# Patient Record
Sex: Female | Born: 1985 | Race: White | Hispanic: No | Marital: Married | Smoking: Never smoker
Health system: Southern US, Community
[De-identification: ages and names within clinical notes are randomized; demographics above are authoritative.]

## PROBLEM LIST (undated history)

## (undated) ENCOUNTER — Inpatient Hospital Stay (HOSPITAL_COMMUNITY): Payer: Self-pay

## (undated) DIAGNOSIS — F32A Depression, unspecified: Secondary | ICD-10-CM

## (undated) DIAGNOSIS — O1490 Unspecified pre-eclampsia, unspecified trimester: Secondary | ICD-10-CM

## (undated) DIAGNOSIS — R3129 Other microscopic hematuria: Secondary | ICD-10-CM

## (undated) DIAGNOSIS — F329 Major depressive disorder, single episode, unspecified: Secondary | ICD-10-CM

## (undated) DIAGNOSIS — F419 Anxiety disorder, unspecified: Secondary | ICD-10-CM

## (undated) DIAGNOSIS — R339 Retention of urine, unspecified: Secondary | ICD-10-CM

## (undated) DIAGNOSIS — K219 Gastro-esophageal reflux disease without esophagitis: Secondary | ICD-10-CM

## (undated) HISTORY — DX: Other microscopic hematuria: R31.29

## (undated) HISTORY — DX: Retention of urine, unspecified: R33.9

---

## 2013-08-21 DIAGNOSIS — O1490 Unspecified pre-eclampsia, unspecified trimester: Secondary | ICD-10-CM

## 2013-08-21 HISTORY — DX: Unspecified pre-eclampsia, unspecified trimester: O14.90

## 2014-04-08 ENCOUNTER — Encounter (HOSPITAL_COMMUNITY): Payer: Self-pay | Admitting: *Deleted

## 2014-04-08 ENCOUNTER — Inpatient Hospital Stay (HOSPITAL_COMMUNITY)
Admission: AD | Admit: 2014-04-08 | Discharge: 2014-04-08 | Disposition: A | Discharge status: Home / Self Care | Payer: Managed Care, Other (non HMO) | Source: Ambulatory Visit | Attending: Obstetrics and Gynecology | Admitting: Obstetrics and Gynecology

## 2014-04-08 DIAGNOSIS — O1492 Unspecified pre-eclampsia, second trimester: Secondary | ICD-10-CM

## 2014-04-08 DIAGNOSIS — IMO0002 Reserved for concepts with insufficient information to code with codable children: Secondary | ICD-10-CM | POA: Diagnosis not present

## 2014-04-08 DIAGNOSIS — R030 Elevated blood-pressure reading, without diagnosis of hypertension: Secondary | ICD-10-CM | POA: Diagnosis present

## 2014-04-08 LAB — COMPREHENSIVE METABOLIC PANEL
ALT: 9 U/L (ref 0–35)
ANION GAP: 13 (ref 5–15)
AST: 17 U/L (ref 0–37)
Albumin: 2.9 g/dL — ABNORMAL LOW (ref 3.5–5.2)
Alkaline Phosphatase: 89 U/L (ref 39–117)
BUN: 9 mg/dL (ref 6–23)
CO2: 23 meq/L (ref 19–32)
Calcium: 8.7 mg/dL (ref 8.4–10.5)
Chloride: 101 mEq/L (ref 96–112)
Creatinine, Ser: 0.67 mg/dL (ref 0.50–1.10)
GFR calc non Af Amer: >90 mL/min (ref >90)
GLUCOSE: 72 mg/dL (ref 70–99)
POTASSIUM: 4.9 meq/L (ref 3.7–5.3)
Sodium: 137 mEq/L (ref 137–147)
TOTAL PROTEIN: 6 g/dL (ref 6.0–8.3)
Total Bilirubin: 0.3 mg/dL (ref 0.3–1.2)

## 2014-04-08 LAB — URINALYSIS, ROUTINE W REFLEX MICROSCOPIC
Bilirubin Urine: NEGATIVE
Glucose, UA: NEGATIVE mg/dL
Hgb urine dipstick: NEGATIVE
KETONES UR: NEGATIVE mg/dL
LEUKOCYTES UA: NEGATIVE
Nitrite: NEGATIVE
PROTEIN: 100 mg/dL — AB
Specific Gravity, Urine: 1.025 (ref 1.005–1.030)
UROBILINOGEN UA: 0.2 mg/dL (ref 0.0–1.0)
pH: 6.5 (ref 5.0–8.0)

## 2014-04-08 LAB — URINE MICROSCOPIC-ADD ON

## 2014-04-08 LAB — CBC
HEMATOCRIT: 35.4 % — AB (ref 36.0–46.0)
HEMOGLOBIN: 12.2 g/dL (ref 12.0–15.0)
MCH: 29.8 pg (ref 26.0–34.0)
MCHC: 34.5 g/dL (ref 30.0–36.0)
MCV: 86.3 fL (ref 78.0–100.0)
Platelets: 162 10*3/uL (ref 150–400)
RBC: 4.1 MIL/uL (ref 3.87–5.11)
RDW: 14.4 % (ref 11.5–15.5)
WBC: 9.5 10*3/uL (ref 4.0–10.5)

## 2014-04-08 LAB — PROTEIN / CREATININE RATIO, URINE
CREATININE, URINE: 154.36 mg/dL
Protein Creatinine Ratio: 0.64 — ABNORMAL HIGH (ref 0.00–0.15)
Total Protein, Urine: 98.5 mg/dL

## 2014-04-08 LAB — URIC ACID: Uric Acid, Serum: 5.6 mg/dL (ref 2.4–7.0)

## 2014-04-08 LAB — LACTATE DEHYDROGENASE: LDH: 202 U/L (ref 94–250)

## 2014-04-08 NOTE — MAU Note (Signed)
Pt presents to MAU from physicians office for [facility] evaluation.

## 2014-04-08 NOTE — MAU Provider Note (Signed)
History     CSN: [id number]  Arrival date and time: 04/08/14 1237   None     Chief Complaint  Patient presents with  . PIH eval    HPI  [name] is a 28 y.o. female G1P0 at 49w1dwho presents to MAU for a PIH evaluation. The patient was sent over from Dr. HDelanna Ahmadioffice following elevated BP readings in the office. The patient denies HA, or blurred vision.   OB History   Grav Para Term Preterm Abortions TAB SAB Ect Mult Living   1         0      Past Medical History  Diagnosis Date  . Medical history non-contributory     Past Surgical History  Procedure Laterality Date  . No past surgeries      History reviewed. No pertinent family history.  History  Substance Use Topics  . Smoking status: Never Smoker   . Smokeless tobacco: Not on file  . Alcohol Use: No    Allergies:  Allergies  Allergen Reactions  . Sulfa Antibiotics Other (See Comments)    Childhood reaction    Prescriptions prior to admission  Medication Sig Dispense Refill  . Doxylamine-Pyridoxine (DICLEGIS PO) Take 1 tablet by mouth daily.      .Marland KitchenOVER THE COUNTER MEDICATION Take 1 tablet by mouth daily. Patient takes Nexium 24 over the counter      . Prenatal Vit-Fe Fumarate-FA (PRENATAL MULTIVITAMIN) TABS tablet Take 1 tablet by mouth daily at 12 noon.      . ranitidine (ZANTAC) 150 MG tablet Take 150 mg by mouth 2 (two) times daily.      . sertraline (ZOLOFT) 25 MG tablet Take 25 mg by mouth daily.       Results for orders placed during the hospital encounter of 04/08/14 (from the past 48 hour(s))  URINALYSIS, ROUTINE W REFLEX MICROSCOPIC     Status: Abnormal   Collection Time    04/08/14  1:35 PM      Result Value Ref Range   Color, Urine YELLOW  YELLOW   APPearance CLEAR  CLEAR   Specific Gravity, Urine 1.025  1.005 - 1.030   pH 6.5  5.0 - 8.0   Glucose, UA NEGATIVE  NEGATIVE mg/dL   Hgb urine dipstick NEGATIVE  NEGATIVE   Bilirubin Urine NEGATIVE  NEGATIVE   Ketones, ur  NEGATIVE  NEGATIVE mg/dL   Protein, ur 100 (*) NEGATIVE mg/dL   Urobilinogen, UA 0.2  0.0 - 1.0 mg/dL   Nitrite NEGATIVE  NEGATIVE   Leukocytes, UA NEGATIVE  NEGATIVE  URINE MICROSCOPIC-ADD ON     Status: Abnormal   Collection Time    04/08/14  1:35 PM      Result Value Ref Range   Squamous Epithelial / LPF FEW (*) RARE   WBC, UA 7-10  <3 WBC/hpf   RBC / HPF 3-6  <3 RBC/hpf   Bacteria, UA MANY (*) RARE   Urine-Other MUCOUS PRESENT    PROTEIN / CREATININE RATIO, URINE     Status: Abnormal   Collection Time    04/08/14  1:35 PM      Result Value Ref Range   Creatinine, Urine 154.36     Total Protein, Urine 98.5     Comment: NO NORMAL RANGE ESTABLISHED FOR THIS TEST   PROTEIN CREATININE RATIO 0.64 (*) 0.00 - 0.15  CBC     Status: Abnormal   Collection Time    04/08/14  2:09 PM      Result Value Ref Range   WBC 9.5  4.0 - 10.5 K/uL   RBC 4.10  3.87 - 5.11 MIL/uL   Hemoglobin 12.2  12.0 - 15.0 g/dL   HCT 35.4 (*) 36.0 - 46.0 %   MCV 86.3  78.0 - 100.0 fL   MCH 29.8  26.0 - 34.0 pg   MCHC 34.5  30.0 - 36.0 g/dL   RDW 14.4  11.5 - 15.5 %   Platelets 162  150 - 400 K/uL  COMPREHENSIVE METABOLIC PANEL     Status: Abnormal   Collection Time    04/08/14  2:09 PM      Result Value Ref Range   Sodium 137  137 - 147 mEq/L   Potassium 4.9  3.7 - 5.3 mEq/L   Chloride 101  96 - 112 mEq/L   CO2 23  19 - 32 mEq/L   Glucose, Bld 72  70 - 99 mg/dL   BUN 9  6 - 23 mg/dL   Creatinine, Ser 0.67  0.50 - 1.10 mg/dL   Calcium 8.7  8.4 - 10.5 mg/dL   Total Protein 6.0  6.0 - 8.3 g/dL   Albumin 2.9 (*) 3.5 - 5.2 g/dL   AST 17  0 - 37 U/L   ALT 9  0 - 35 U/L   Alkaline Phosphatase 89  39 - 117 U/L   Total Bilirubin 0.3  0.3 - 1.2 mg/dL   GFR calc non Af Amer >90  >90 mL/min   GFR calc Af Amer >90  >90 mL/min   Comment: (NOTE)     The eGFR has been calculated using the CKD EPI equation.     This calculation has not been validated in all clinical situations.     eGFR's persistently <90  mL/min signify possible Chronic Kidney     Disease.   Anion gap 13  5 - 15  URIC ACID     Status: None   Collection Time    04/08/14  2:09 PM      Result Value Ref Range   Uric Acid, Serum 5.6  2.4 - 7.0 mg/dL  LACTATE DEHYDROGENASE     Status: None   Collection Time    04/08/14  2:09 PM      Result Value Ref Range   LDH 202  94 - 250 U/L    Review of Systems  Constitutional: Negative for fever and chills.  Eyes: Negative for blurred vision.  Respiratory: Negative for shortness of breath.   Cardiovascular: Negative for leg swelling.  Gastrointestinal: Positive for nausea and abdominal pain (+ Upper abdominal discomfort ).  Neurological: Negative for headaches.   Physical Exam   Blood pressure 147/91, pulse 65, resp. rate 18, last menstrual period 09/23/2013.  Physical Exam  Constitutional: She is oriented to person, place, and time. She appears well-developed and well-nourished. No distress.  HENT:  Head: Normocephalic.  Eyes: Pupils are equal, round, and reactive to light.  Neck: Neck supple.  Cardiovascular: Normal rate.   Respiratory: Effort normal and breath sounds normal. No respiratory distress.  GI: Soft. She exhibits no distension. There is no tenderness. There is no rebound and no guarding.  Musculoskeletal:       Right ankle: She exhibits no swelling.       Left ankle: She exhibits no swelling.  Neurological: She is alert and oriented to person, place, and time. She has normal reflexes.  Skin: Skin is warm.  She is not diaphoretic.  Psychiatric: Her behavior is normal. Her mood appears anxious.    Fetal Tracing: Baseline: 130 bpm  Variability: Moderate  Accelerations: 15x15 Decelerations: none Toco: None  MAU Course  Procedures None  MDM CBC CMET Uric acid LDH Protein/Creatine urine  Urine culture  Discussed patient and results with Dr. Matthew Saras.   Assessment and Plan   A:  1. Preeclampsia, second trimester    P:  Discharge home in  stable condition Follow up with Dr. Matthew Saras on Friday; the office will call you Preeclampsia discussed with patient; warning signs discussed    Darrelyn Hillock Jadarius Commons, NP  04/08/2014, 7:33 PM

## 2014-04-08 NOTE — Discharge Instructions (Signed)
Hypertension During Pregnancy Hypertension, or high blood pressure, is when there is extra pressure inside your blood vessels that carry blood from the heart to the rest of your body (arteries). It can happen at any time in life, including pregnancy. Hypertension during pregnancy can cause problems for you and your baby. Your baby might not weigh as much as he or she should at birth or might be born early (premature). Very bad cases of hypertension during pregnancy can be life-threatening.  Different types of hypertension can occur during pregnancy. These include:  Chronic hypertension. This happens when a woman has hypertension before pregnancy and it continues during pregnancy.  Gestational hypertension. This is when hypertension develops during pregnancy.  Preeclampsia or toxemia of pregnancy. This is a very serious type of hypertension that develops only during pregnancy. It affects the whole body and can be very dangerous for both mother and baby.  Gestational hypertension and preeclampsia usually go away after your baby is born. Your blood pressure will likely stabilize within 6 weeks. Women who have hypertension during pregnancy have a greater chance of developing hypertension later in life or with future pregnancies. RISK FACTORS There are certain factors that make it more likely for you to develop hypertension during pregnancy. These include:  Having hypertension before pregnancy.  Having hypertension during a previous pregnancy.  Being overweight.  Being older than 40 years.  Being pregnant with more than one baby.  Having diabetes or kidney problems. SIGNS AND SYMPTOMS Chronic and gestational hypertension rarely cause symptoms. Preeclampsia has symptoms, which may include:  Increased protein in your urine. Your health care provider will check for this at every prenatal visit.  Swelling of your hands and face.  Rapid weight gain.  Headaches.  Visual changes.  Being  bothered by light.  Abdominal pain, especially in the upper right area.  Chest pain.  Shortness of breath.  Increased reflexes.  Seizures. These occur with a more severe form of preeclampsia, called eclampsia. DIAGNOSIS  You may be diagnosed with hypertension during a regular prenatal exam. At each prenatal visit, you may have:  Your blood pressure checked.  A urine test to check for protein in your urine. The type of hypertension you are diagnosed with depends on when you developed it. It also depends on your specific blood pressure reading.  Developing hypertension before 20 weeks of pregnancy is consistent with chronic hypertension.  Developing hypertension after 20 weeks of pregnancy is consistent with gestational hypertension.  Hypertension with increased urinary protein is diagnosed as preeclampsia.  Blood pressure measurements that stay above 834 systolic or 196 diastolic are a sign of severe preeclampsia. TREATMENT Treatment for hypertension during pregnancy varies. Treatment depends on the type of hypertension and how serious it is.  If you take medicine for chronic hypertension, you may need to switch medicines.  Medicines called ACE inhibitors should not be taken during pregnancy.  Low-dose aspirin may be suggested for women who have risk factors for preeclampsia.  If you have gestational hypertension, you may need to take a blood pressure medicine that is safe during pregnancy. Your health care provider will recommend the correct medicine.  If you have severe preeclampsia, you may need to be in the hospital. Health care providers will watch you and your baby very closely. You also may need to take medicine called magnesium sulfate to prevent seizures and lower blood pressure.  Sometimes, an early delivery is needed. This may be the case if the condition worsens. It would be  done to protect you and your baby. The only cure for preeclampsia is delivery.  Your health  care provider may recommend that you take one low-dose aspirin (81 mg) each day to help prevent high blood pressure during your pregnancy if you are at risk for preeclampsia. You may be at risk for preeclampsia if:  You had preeclampsia or eclampsia during a previous pregnancy.  Your baby did not grow as expected during a previous pregnancy.  You experienced preterm birth with a previous pregnancy.  You experienced a separation of the placenta from the uterus (placental abruption) during a previous pregnancy.  You experienced the loss of your baby during a previous pregnancy.  You are pregnant with more than one baby.  You have other medical conditions, such as diabetes or an autoimmune disease. HOME CARE INSTRUCTIONS  Schedule and keep all of your regular prenatal care appointments. This is important.  Take medicines only as directed by your health care provider. Tell your health care provider about all medicines you take.  Eat as little salt as possible.  Get regular exercise.  Do not drink alcohol.  Do not use tobacco products.  Do not drink products with caffeine.  Lie on your left side when resting. SEEK IMMEDIATE MEDICAL CARE IF:  You have severe abdominal pain.  You have sudden swelling in your hands, ankles, or face.  You gain 4 pounds (1.8 kg) or more in 1 week.  You vomit repeatedly.  You have vaginal bleeding.  You do not feel your baby moving as much.  You have a headache.  You have blurred or double vision.  You have muscle twitching or spasms.  You have shortness of breath.  You have blue fingernails or lips.  You have blood in your urine. MAKE SURE YOU:  Understand these instructions.  Will watch your condition.  Will get help right away if you are not doing well or get worse. Document Released: 04/25/2011 Document Revised: 12/22/2013 Document Reviewed: 03/06/2013 [facility] Patient Information 2015 [place], [place]. This information is not  intended to replace advice given to you by your health care provider. Make sure you discuss any questions you have with your health care provider.  Preeclampsia and Eclampsia Preeclampsia is a serious condition that develops only during pregnancy. It is also called toxemia of pregnancy. This condition causes high blood pressure along with other symptoms, such as swelling and headaches. These may develop as the condition gets worse. Preeclampsia may occur 20 weeks or later into your pregnancy.  Diagnosing and treating preeclampsia early is very important. If not treated early, it can cause serious problems for you and your baby. One problem it can lead to is eclampsia, which is a condition that causes muscle jerking or shaking (convulsions) in the mother. Delivering your baby is the best treatment for preeclampsia or eclampsia.  RISK FACTORS The cause of preeclampsia is not known. You may be more likely to develop preeclampsia if you have certain risk factors. These include:   Being pregnant for the first time.  Having preeclampsia in a past pregnancy.  Having a family history of preeclampsia.  Having high blood pressure.  Being pregnant with twins or triplets.  Being 40 or older.  Being African American.  Having kidney disease or diabetes.  Having medical conditions such as lupus or blood diseases.  Being very overweight (obese). SIGNS AND SYMPTOMS  The earliest signs of preeclampsia are:  High blood pressure.  Increased protein in your urine. Your health care  provider will check for this at every prenatal visit. Other symptoms that can develop include:   Severe headaches.  Sudden weight gain.  Swelling of your hands, face, legs, and feet.  Feeling sick to your stomach (nauseous) and throwing up (vomiting).  Vision problems (blurred or double vision).  Numbness in your face, arms, legs, and feet.  Dizziness.  Slurred speech.  Sensitivity to bright  lights.  Abdominal pain. DIAGNOSIS  There are no screening tests for preeclampsia. Your health care provider will ask you about symptoms and check for signs of preeclampsia during your prenatal visits. You may also have tests, including:  Urine testing.  Blood testing.  Checking your baby's heart rate.  Checking the health of your baby and your placenta using images created with sound waves (ultrasound). TREATMENT  You can work out the best treatment approach together with your health care provider. It is very important to keep all prenatal appointments. If you have an increased risk of preeclampsia, you may need more frequent prenatal exams.  Your health care provider may prescribe bed rest.  You may have to eat as little salt as possible.  You may need to take medicine to lower your blood pressure if the condition does not respond to more conservative measures.  You may need to stay in the hospital if your condition is severe. There, treatment will focus on controlling your blood pressure and fluid retention. You may also need to take medicine to prevent seizures.  If the condition gets worse, your baby may need to be delivered early to protect you and the baby. You may have your labor started with medicine (be induced), or you may have a cesarean delivery.  Preeclampsia usually goes away after the baby is born. HOME CARE INSTRUCTIONS   Only take over-the-counter or prescription medicines as directed by your health care provider.  Lie on your left side while resting. This keeps pressure off your baby.  Elevate your feet while resting.  Get regular exercise. Ask your health care provider what type of exercise is safe for you.  Avoid caffeine and alcohol.  Do not smoke.  Drink 6-8 glasses of water every day.  Eat a balanced diet that is low in salt. Do not add salt to your food.  Avoid stressful situations as much as possible.  Get plenty of rest and sleep.  Keep all  prenatal appointments and tests as scheduled. SEEK MEDICAL CARE IF:  You are gaining more weight than expected.  You have any headaches, abdominal pain, or nausea.  You are bruising more than usual.  You feel dizzy or light-headed. SEEK IMMEDIATE MEDICAL CARE IF:   You develop sudden or severe swelling anywhere in your body. This usually happens in the legs.  You gain 5 lb (2.3 kg) or more in a week.  You have a severe headache, dizziness, problems with your vision, or confusion.  You have severe abdominal pain.  You have lasting nausea or vomiting.  You have a seizure.  You have trouble moving any part of your body.  You develop numbness in your body.  You have trouble speaking.  You have any abnormal bleeding.  You develop a stiff neck.  You pass out. MAKE SURE YOU:   Understand these instructions.  Will watch your condition.  Will get help right away if you are not doing well or get worse. Document Released: 08/04/2000 Document Revised: 08/12/2013 Document Reviewed: 05/30/2013 [facility] Patient Information 2015 [place], [place]. This information is not  intended to replace advice given to you by your health care provider. Make sure you discuss any questions you have with your health care provider. ° °

## 2014-04-09 ENCOUNTER — Encounter (HOSPITAL_COMMUNITY): Payer: Self-pay | Admitting: Pediatrics

## 2014-04-09 ENCOUNTER — Inpatient Hospital Stay (HOSPITAL_COMMUNITY): Payer: Managed Care, Other (non HMO)

## 2014-04-09 ENCOUNTER — Inpatient Hospital Stay (HOSPITAL_COMMUNITY)
Admission: AD | Admit: 2014-04-09 | Discharge: 2014-04-09 | Disposition: A | Discharge status: Home / Self Care | Payer: Managed Care, Other (non HMO) | Source: Ambulatory Visit | Attending: Obstetrics & Gynecology | Admitting: Obstetrics & Gynecology

## 2014-04-09 DIAGNOSIS — O133 Gestational [pregnancy-induced] hypertension without significant proteinuria, third trimester: Secondary | ICD-10-CM

## 2014-04-09 DIAGNOSIS — O139 Gestational [pregnancy-induced] hypertension without significant proteinuria, unspecified trimester: Secondary | ICD-10-CM | POA: Insufficient documentation

## 2014-04-09 DIAGNOSIS — R109 Unspecified abdominal pain: Secondary | ICD-10-CM | POA: Diagnosis not present

## 2014-04-09 DIAGNOSIS — R030 Elevated blood-pressure reading, without diagnosis of hypertension: Secondary | ICD-10-CM | POA: Diagnosis present

## 2014-04-09 LAB — COMPREHENSIVE METABOLIC PANEL
ALBUMIN: 2.7 g/dL — AB (ref 3.5–5.2)
ALK PHOS: 84 U/L (ref 39–117)
ALT: 9 U/L (ref 0–35)
ANION GAP: 9 (ref 5–15)
AST: 19 U/L (ref 0–37)
BUN: 12 mg/dL (ref 6–23)
CO2: 23 mEq/L (ref 19–32)
CREATININE: 0.69 mg/dL (ref 0.50–1.10)
Calcium: 8.4 mg/dL (ref 8.4–10.5)
Chloride: 102 mEq/L (ref 96–112)
GFR calc non Af Amer: >90 mL/min (ref >90)
GLUCOSE: 95 mg/dL (ref 70–99)
Potassium: 4.6 mEq/L (ref 3.7–5.3)
Sodium: 134 mEq/L — ABNORMAL LOW (ref 137–147)
TOTAL PROTEIN: 6.3 g/dL (ref 6.0–8.3)
Total Bilirubin: 0.3 mg/dL (ref 0.3–1.2)

## 2014-04-09 LAB — CBC
HEMATOCRIT: 34.4 % — AB (ref 36.0–46.0)
Hemoglobin: 11.9 g/dL — ABNORMAL LOW (ref 12.0–15.0)
MCH: 30 pg (ref 26.0–34.0)
MCHC: 34.6 g/dL (ref 30.0–36.0)
MCV: 86.6 fL (ref 78.0–100.0)
Platelets: 166 10*3/uL (ref 150–400)
RBC: 3.97 MIL/uL (ref 3.87–5.11)
RDW: 14.5 % (ref 11.5–15.5)
WBC: 10.5 10*3/uL (ref 4.0–10.5)

## 2014-04-09 LAB — URINALYSIS, ROUTINE W REFLEX MICROSCOPIC
Bilirubin Urine: NEGATIVE
GLUCOSE, UA: NEGATIVE mg/dL
Hgb urine dipstick: NEGATIVE
KETONES UR: 15 mg/dL — AB
Leukocytes, UA: NEGATIVE
Nitrite: NEGATIVE
PROTEIN: 100 mg/dL — AB
Specific Gravity, Urine: >1.03 — ABNORMAL HIGH (ref 1.005–1.030)
UROBILINOGEN UA: 0.2 mg/dL (ref 0.0–1.0)
pH: 6 (ref 5.0–8.0)

## 2014-04-09 LAB — URINE MICROSCOPIC-ADD ON

## 2014-04-09 LAB — URINE CULTURE

## 2014-04-09 LAB — URIC ACID: URIC ACID, SERUM: 6.4 mg/dL (ref 2.4–7.0)

## 2014-04-09 LAB — PROTEIN / CREATININE RATIO, URINE
Creatinine, Urine: 295.18 mg/dL
PROTEIN CREATININE RATIO: 0.59 — AB (ref 0.00–0.15)
Total Protein, Urine: 174.9 mg/dL

## 2014-04-09 LAB — LACTATE DEHYDROGENASE: LDH: 200 U/L (ref 94–250)

## 2014-04-09 MED ORDER — BETAMETHASONE SOD PHOS & ACET 6 (3-3) MG/ML IJ SUSP
12.0000 mg | Freq: Once | INTRAMUSCULAR | Status: AC
  Administered 2014-04-09: 12 mg via INTRAMUSCULAR
  Filled 2014-04-09: qty 2

## 2014-04-09 NOTE — MAU Provider Note (Signed)
History     CSN: [id number]  Arrival date and time: 04/09/14 1534   First Provider Initiated Contact with Patient 04/09/14 1621      Chief Complaint  Patient presents with  . Hypertension   HPI Comments: Diana Simpson 28 y.o. G1P0 [redacted]w[redacted]d presents to MAU with elevated BP and abdominal pains earlier today. She was seen yesterday here and diagnosed with early preeclampsia and sent home. She went home and was doing well today until she checked her BP at a pharmacy and it was elevated. She states her abdomen was bothering her earlier across her abdomen but is now fine. She denies any headache, blurred vision, edema.       Past Medical History  Diagnosis Date  . Medical history non-contributory     Past Surgical History  Procedure Laterality Date  . No past surgeries      History reviewed. No pertinent family history.  History  Substance Use Topics  . Smoking status: Never Smoker   . Smokeless tobacco: Not on file  . Alcohol Use: No    Allergies:  Allergies  Allergen Reactions  . Sulfa Antibiotics Other (See Comments)    Childhood reaction    Prescriptions prior to admission  Medication Sig Dispense Refill  . Doxylamine-Pyridoxine (DICLEGIS PO) Take 1 tablet by mouth daily.      Marland Kitchen OVER THE COUNTER MEDICATION Take 1 tablet by mouth daily. Patient takes Nexium 24 over the counter      . Prenatal Vit-Fe Fumarate-FA (PRENATAL MULTIVITAMIN) TABS tablet Take 1 tablet by mouth daily at 12 noon.      . ranitidine (ZANTAC) 150 MG tablet Take 150 mg by mouth 2 (two) times daily.      . sertraline (ZOLOFT) 25 MG tablet Take 25 mg by mouth daily.        Review of Systems  Constitutional: Negative.   HENT: Negative.   Eyes: Negative.   Respiratory: Negative.   Cardiovascular: Negative.   Gastrointestinal: Negative.   Genitourinary: Negative.   Musculoskeletal: Negative.   Skin: Negative.   Neurological: Negative.   Endo/Heme/Allergies: Negative.   Psychiatric/Behavioral:  Negative.    Physical Exam   Blood pressure 161/103, pulse 70, temperature 98.6 F (37 C), temperature source Oral, resp. rate 16, last menstrual period 09/23/2013.  Physical Exam  Constitutional: She is oriented to person, place, and time. She appears well-developed and well-nourished. No distress.  HENT:  Head: Normocephalic and atraumatic.  Eyes: Conjunctivae are normal. Pupils are equal, round, and reactive to light.  Cardiovascular: Normal rate, regular rhythm and normal heart sounds.   Respiratory: Effort normal and breath sounds normal.  GI: Soft. Bowel sounds are normal. She exhibits no distension. There is no tenderness. There is no rebound and no guarding.  Genitourinary:  Not examined  Musculoskeletal: Normal range of motion. She exhibits no edema and no tenderness.  Neurological: She is alert and oriented to person, place, and time.  Skin: Skin is warm and dry.  Psychiatric: She has a normal mood and affect. Her behavior is normal. Judgment and thought content normal.   Results for orders placed during the hospital encounter of 04/09/14 (from the past 24 hour(s))  PROTEIN / CREATININE RATIO, URINE     Status: Abnormal   Collection Time    04/09/14  3:45 PM      Result Value Ref Range   Creatinine, Urine 295.18     Total Protein, Urine 174.9     PROTEIN CREATININE RATIO 0.59 (*)  0.00 - 0.15  CBC     Status: Abnormal   Collection Time    04/09/14  4:45 PM      Result Value Ref Range   WBC 10.5  4.0 - 10.5 K/uL   RBC 3.97  3.87 - 5.11 MIL/uL   Hemoglobin 11.9 (*) 12.0 - 15.0 g/dL   HCT 34.4 (*) 36.0 - 46.0 %   MCV 86.6  78.0 - 100.0 fL   MCH 30.0  26.0 - 34.0 pg   MCHC 34.6  30.0 - 36.0 g/dL   RDW 14.5  11.5 - 15.5 %   Platelets 166  150 - 400 K/uL  COMPREHENSIVE METABOLIC PANEL     Status: Abnormal   Collection Time    04/09/14  4:45 PM      Result Value Ref Range   Sodium 134 (*) 137 - 147 mEq/L   Potassium 4.6  3.7 - 5.3 mEq/L   Chloride 102  96 - 112  mEq/L   CO2 23  19 - 32 mEq/L   Glucose, Bld 95  70 - 99 mg/dL   BUN 12  6 - 23 mg/dL   Creatinine, Ser 0.69  0.50 - 1.10 mg/dL   Calcium 8.4  8.4 - 10.5 mg/dL   Total Protein 6.3  6.0 - 8.3 g/dL   Albumin 2.7 (*) 3.5 - 5.2 g/dL   AST 19  0 - 37 U/L   ALT 9  0 - 35 U/L   Alkaline Phosphatase 84  39 - 117 U/L   Total Bilirubin 0.3  0.3 - 1.2 mg/dL   GFR calc non Af Amer >90  >90 mL/min   GFR calc Af Amer >90  >90 mL/min   Anion gap 9  5 - 15  URIC ACID     Status: None   Collection Time    04/09/14  4:45 PM      Result Value Ref Range   Uric Acid, Serum 6.4  2.4 - 7.0 mg/dL  LACTATE DEHYDROGENASE     Status: None   Collection Time    04/09/14  4:45 PM      Result Value Ref Range   LDH 200  94 - 250 U/L   Preliminary U/S Report to Dr Lynnette Caffey  Last BP was 134/84 MAU Course  Procedures  MDM  [facility] labs Called Dr Lynnette Caffey who advised ultrasound for growth with doppler and celestone 12 mg today and in 24 hours   Assessment and Plan   A: PIH  P: PIH precautions Return tomorrow for second dose Celestone  Continue to monitor BPs Return to office Monday   Georgia Duff 04/09/2014, 4:33 PM

## 2014-04-09 NOTE — MAU Note (Signed)
Pt. sts. she was at Dr. Gabriel Carina this afternoon and was sent here by Dr. Lynnette Caffey because of high blood pressure reading. Primary Dr. Is Dr. Orvan Seen

## 2014-04-09 NOTE — Discharge Instructions (Signed)
Preeclampsia and Eclampsia °Preeclampsia is a serious condition that develops only during pregnancy. It is also called toxemia of pregnancy. This condition causes high blood pressure along with other symptoms, such as swelling and headaches. These may develop as the condition gets worse. Preeclampsia may occur 20 weeks or later into your pregnancy.  °Diagnosing and treating preeclampsia early is very important. If not treated early, it can cause serious problems for you and your baby. One problem it can lead to is eclampsia, which is a condition that causes muscle jerking or shaking (convulsions) in the mother. Delivering your baby is the best treatment for preeclampsia or eclampsia.  °RISK FACTORS °The cause of preeclampsia is not known. You may be more likely to develop preeclampsia if you have certain risk factors. These include:  °· Being pregnant for the first time. °· Having preeclampsia in a past pregnancy. °· Having a family history of preeclampsia. °· Having high blood pressure. °· Being pregnant with twins or triplets. °· Being 35 or older. °· Being African American. °· Having kidney disease or diabetes. °· Having medical conditions such as lupus or blood diseases. °· Being very overweight (obese). °SIGNS AND SYMPTOMS  °The earliest signs of preeclampsia are: °· High blood pressure. °· Increased protein in your urine. Your health care provider will check for this at every prenatal visit. °Other symptoms that can develop include:  °· Severe headaches. °· Sudden weight gain. °· Swelling of your hands, face, legs, and feet. °· Feeling sick to your stomach (nauseous) and throwing up (vomiting). °· Vision problems (blurred or double vision). °· Numbness in your face, arms, legs, and feet. °· Dizziness. °· Slurred speech. °· Sensitivity to bright lights. °· Abdominal pain. °DIAGNOSIS  °There are no screening tests for preeclampsia. Your health care provider will ask you about symptoms and check for signs of  preeclampsia during your prenatal visits. You may also have tests, including: °· Urine testing. °· Blood testing. °· Checking your baby's heart rate. °· Checking the health of your baby and your placenta using images created with sound waves (ultrasound). °TREATMENT  °You can work out the best treatment approach together with your health care provider. It is very important to keep all prenatal appointments. If you have an increased risk of preeclampsia, you may need more frequent prenatal exams. °· Your health care provider may prescribe bed rest. °· You may have to eat as little salt as possible. °· You may need to take medicine to lower your blood pressure if the condition does not respond to more conservative measures. °· You may need to stay in the hospital if your condition is severe. There, treatment will focus on controlling your blood pressure and fluid retention. You may also need to take medicine to prevent seizures. °· If the condition gets worse, your baby may need to be delivered early to protect you and the baby. You may have your labor started with medicine (be induced), or you may have a cesarean delivery. °· Preeclampsia usually goes away after the baby is born. °HOME CARE INSTRUCTIONS  °· Only take over-the-counter or prescription medicines as directed by your health care provider. °· Lie on your left side while resting. This keeps pressure off your baby. °· Elevate your feet while resting. °· Get regular exercise. Ask your health care provider what type of exercise is safe for you. °· Avoid caffeine and alcohol. °· Do not smoke. °· Drink 6-8 glasses of water every day. °· Eat a balanced diet   that is low in salt. Do not add salt to your food.  Avoid stressful situations as much as possible.  Get plenty of rest and sleep.  Keep all prenatal appointments and tests as scheduled. SEEK MEDICAL CARE IF:  You are gaining more weight than expected.  You have any headaches, abdominal pain, or  nausea.  You are bruising more than usual.  You feel dizzy or light-headed. SEEK IMMEDIATE MEDICAL CARE IF:   You develop sudden or severe swelling anywhere in your body. This usually happens in the legs.  You gain 5 lb (2.3 kg) or more in a week.  You have a severe headache, dizziness, problems with your vision, or confusion.  You have severe abdominal pain.  You have lasting nausea or vomiting.  You have a seizure.  You have trouble moving any part of your body.  You develop numbness in your body.  You have trouble speaking.  You have any abnormal bleeding.  You develop a stiff neck.  You pass out. MAKE SURE YOU:   Understand these instructions.  Will watch your condition.  Will get help right away if you are not doing well or get worse. Document Released: 08/04/2000 Document Revised: 08/12/2013 Document Reviewed: 05/30/2013 [facility] Patient Information 2015 [place], [place]. This information is not intended to replace advice given to you by your health care provider. Make sure you discuss any questions you have with your health care provider.  Hypertension During Pregnancy Hypertension is also called high blood pressure. Blood pressure moves blood in your body. Sometimes, the force that moves the blood becomes too strong. When you are pregnant, this condition should be watched carefully. It can cause problems for you and your baby. HOME CARE   Make and keep all of your doctor visits.  Take medicine as told by your doctor. Tell your doctor about all medicines you take.  Eat very little salt.  Exercise regularly.  Do not drink alcohol.  Do not smoke.  Do not have drinks with caffeine.  Lie on your left side when resting.  Your health care provider may ask you to take one low-dose aspirin (81mg ) each day. GET HELP RIGHT AWAY IF:  You have bad belly (abdominal) pain.  You have sudden puffiness (swelling) in the hands, ankles, or face.  You gain 4  pounds (1.8 kilograms) or more in 1 week.  You throw up (vomit) repeatedly.  You have bleeding from the vagina.  You do not feel the baby moving as much.  You have a headache.  You have blurred or double vision.  You have muscle twitching or spasms.  You have shortness of breath.  You have blue fingernails and lips.  You have blood in your pee (urine). MAKE SURE YOU:  Understand these instructions.  Will watch your condition.  Will get help right away if you are not doing well or get worse. Document Released: 09/09/2010 Document Revised: 12/22/2013 Document Reviewed: 03/06/2013 [facility] Patient Information 2015 [place], [place]. This information is not intended to replace advice given to you by your health care provider. Make sure you discuss any questions you have with your health care provider.  Hypertension During Pregnancy Hypertension, or high blood pressure, is when there is extra pressure inside your blood vessels that carry blood from the heart to the rest of your body (arteries). It can happen at any time in life, including pregnancy. Hypertension during pregnancy can cause problems for you and your baby. Your baby might not weigh as  much as he or she should at birth or might be born early (premature). Very bad cases of hypertension during pregnancy can be life-threatening.  Different types of hypertension can occur during pregnancy. These include:  Chronic hypertension. This happens when a woman has hypertension before pregnancy and it continues during pregnancy.  Gestational hypertension. This is when hypertension develops during pregnancy.  Preeclampsia or toxemia of pregnancy. This is a very serious type of hypertension that develops only during pregnancy. It affects the whole body and can be very dangerous for both mother and baby.  Gestational hypertension and preeclampsia usually go away after your baby is born. Your blood pressure will likely stabilize within 6  weeks. Women who have hypertension during pregnancy have a greater chance of developing hypertension later in life or with future pregnancies. RISK FACTORS There are certain factors that make it more likely for you to develop hypertension during pregnancy. These include:  Having hypertension before pregnancy.  Having hypertension during a previous pregnancy.  Being overweight.  Being older than 40 years.  Being pregnant with more than one baby.  Having diabetes or kidney problems. SIGNS AND SYMPTOMS Chronic and gestational hypertension rarely cause symptoms. Preeclampsia has symptoms, which may include:  Increased protein in your urine. Your health care provider will check for this at every prenatal visit.  Swelling of your hands and face.  Rapid weight gain.  Headaches.  Visual changes.  Being bothered by light.  Abdominal pain, especially in the upper right area.  Chest pain.  Shortness of breath.  Increased reflexes.  Seizures. These occur with a more severe form of preeclampsia, called eclampsia. DIAGNOSIS  You may be diagnosed with hypertension during a regular prenatal exam. At each prenatal visit, you may have:  Your blood pressure checked.  A urine test to check for protein in your urine. The type of hypertension you are diagnosed with depends on when you developed it. It also depends on your specific blood pressure reading.  Developing hypertension before 20 weeks of pregnancy is consistent with chronic hypertension.  Developing hypertension after 20 weeks of pregnancy is consistent with gestational hypertension.  Hypertension with increased urinary protein is diagnosed as preeclampsia.  Blood pressure measurements that stay above 280 systolic or 034 diastolic are a sign of severe preeclampsia. TREATMENT Treatment for hypertension during pregnancy varies. Treatment depends on the type of hypertension and how serious it is.  If you take medicine for  chronic hypertension, you may need to switch medicines.  Medicines called ACE inhibitors should not be taken during pregnancy.  Low-dose aspirin may be suggested for women who have risk factors for preeclampsia.  If you have gestational hypertension, you may need to take a blood pressure medicine that is safe during pregnancy. Your health care provider will recommend the correct medicine.  If you have severe preeclampsia, you may need to be in the hospital. Health care providers will watch you and your baby very closely. You also may need to take medicine called magnesium sulfate to prevent seizures and lower blood pressure.  Sometimes, an early delivery is needed. This may be the case if the condition worsens. It would be done to protect you and your baby. The only cure for preeclampsia is delivery.  Your health care provider may recommend that you take one low-dose aspirin (81 mg) each day to help prevent high blood pressure during your pregnancy if you are at risk for preeclampsia. You may be at risk for preeclampsia if:  You  had preeclampsia or eclampsia during a previous pregnancy.  Your baby did not grow as expected during a previous pregnancy.  You experienced preterm birth with a previous pregnancy.  You experienced a separation of the placenta from the uterus (placental abruption) during a previous pregnancy.  You experienced the loss of your baby during a previous pregnancy.  You are pregnant with more than one baby.  You have other medical conditions, such as diabetes or an autoimmune disease. HOME CARE INSTRUCTIONS  Schedule and keep all of your regular prenatal care appointments. This is important.  Take medicines only as directed by your health care provider. Tell your health care provider about all medicines you take.  Eat as little salt as possible.  Get regular exercise.  Do not drink alcohol.  Do not use tobacco products.  Do not drink products with  caffeine.  Lie on your left side when resting. SEEK IMMEDIATE MEDICAL CARE IF:  You have severe abdominal pain.  You have sudden swelling in your hands, ankles, or face.  You gain 4 pounds (1.8 kg) or more in 1 week.  You vomit repeatedly.  You have vaginal bleeding.  You do not feel your baby moving as much.  You have a headache.  You have blurred or double vision.  You have muscle twitching or spasms.  You have shortness of breath.  You have blue fingernails or lips.  You have blood in your urine. MAKE SURE YOU:  Understand these instructions.  Will watch your condition.  Will get help right away if you are not doing well or get worse. Document Released: 04/25/2011 Document Revised: 12/22/2013 Document Reviewed: 03/06/2013 [facility] Patient Information 2015 [place], [place]. This information is not intended to replace advice given to you by your health care provider. Make sure you discuss any questions you have with your health care provider.

## 2014-04-10 ENCOUNTER — Inpatient Hospital Stay (HOSPITAL_COMMUNITY)
Admission: AD | Admit: 2014-04-10 | Discharge: 2014-04-10 | Disposition: A | Discharge status: Home / Self Care | Payer: Managed Care, Other (non HMO) | Source: Ambulatory Visit | Attending: Obstetrics and Gynecology | Admitting: Obstetrics and Gynecology

## 2014-04-10 DIAGNOSIS — O4700 False labor before 37 completed weeks of gestation, unspecified trimester: Secondary | ICD-10-CM | POA: Insufficient documentation

## 2014-04-10 MED ORDER — BETAMETHASONE SOD PHOS & ACET 6 (3-3) MG/ML IJ SUSP
12.0000 mg | Freq: Once | INTRAMUSCULAR | Status: AC
  Administered 2014-04-10: 12 mg via INTRAMUSCULAR
  Filled 2014-04-10: qty 2

## 2014-04-26 ENCOUNTER — Encounter (HOSPITAL_COMMUNITY): Payer: Self-pay

## 2014-04-26 ENCOUNTER — Inpatient Hospital Stay (HOSPITAL_COMMUNITY): Payer: Managed Care, Other (non HMO)

## 2014-04-26 ENCOUNTER — Inpatient Hospital Stay (HOSPITAL_COMMUNITY)
Admission: AD | Admit: 2014-04-26 | Discharge: 2014-05-02 | DRG: 765 | Disposition: A | Discharge status: Home / Self Care | Payer: Managed Care, Other (non HMO) | Source: Ambulatory Visit | Attending: Obstetrics and Gynecology | Admitting: Obstetrics and Gynecology

## 2014-04-26 DIAGNOSIS — D689 Coagulation defect, unspecified: Secondary | ICD-10-CM | POA: Diagnosis present

## 2014-04-26 DIAGNOSIS — F411 Generalized anxiety disorder: Secondary | ICD-10-CM | POA: Diagnosis present

## 2014-04-26 DIAGNOSIS — O1414 Severe pre-eclampsia complicating childbirth: Secondary | ICD-10-CM | POA: Diagnosis present

## 2014-04-26 DIAGNOSIS — O9912 Other diseases of the blood and blood-forming organs and certain disorders involving the immune mechanism complicating childbirth: Secondary | ICD-10-CM

## 2014-04-26 DIAGNOSIS — O1490 Unspecified pre-eclampsia, unspecified trimester: Secondary | ICD-10-CM | POA: Diagnosis present

## 2014-04-26 DIAGNOSIS — D696 Thrombocytopenia, unspecified: Secondary | ICD-10-CM | POA: Diagnosis present

## 2014-04-26 DIAGNOSIS — IMO0002 Reserved for concepts with insufficient information to code with codable children: Secondary | ICD-10-CM

## 2014-04-26 DIAGNOSIS — O99344 Other mental disorders complicating childbirth: Secondary | ICD-10-CM | POA: Diagnosis present

## 2014-04-26 DIAGNOSIS — R030 Elevated blood-pressure reading, without diagnosis of hypertension: Secondary | ICD-10-CM | POA: Diagnosis present

## 2014-04-26 DIAGNOSIS — O1492 Unspecified pre-eclampsia, second trimester: Secondary | ICD-10-CM

## 2014-04-26 HISTORY — DX: Depression, unspecified: F32.A

## 2014-04-26 HISTORY — DX: Major depressive disorder, single episode, unspecified: F32.9

## 2014-04-26 HISTORY — DX: Anxiety disorder, unspecified: F41.9

## 2014-04-26 LAB — URINALYSIS, ROUTINE W REFLEX MICROSCOPIC
Bilirubin Urine: NEGATIVE
GLUCOSE, UA: NEGATIVE mg/dL
Hgb urine dipstick: NEGATIVE
Ketones, ur: NEGATIVE mg/dL
LEUKOCYTES UA: NEGATIVE
NITRITE: NEGATIVE
PH: 7 (ref 5.0–8.0)
Protein, ur: >300 mg/dL — AB
SPECIFIC GRAVITY, URINE: 1.02 (ref 1.005–1.030)
Urobilinogen, UA: 0.2 mg/dL (ref 0.0–1.0)

## 2014-04-26 LAB — OB RESULTS CONSOLE RUBELLA ANTIBODY, IGM: RUBELLA: IMMUNE

## 2014-04-26 LAB — OB RESULTS CONSOLE ANTIBODY SCREEN: Antibody Screen: NEGATIVE

## 2014-04-26 LAB — URINE MICROSCOPIC-ADD ON

## 2014-04-26 LAB — CBC
HCT: 33.3 % — ABNORMAL LOW (ref 36.0–46.0)
Hemoglobin: 11.3 g/dL — ABNORMAL LOW (ref 12.0–15.0)
MCH: 29.7 pg (ref 26.0–34.0)
MCHC: 33.9 g/dL (ref 30.0–36.0)
MCV: 87.4 fL (ref 78.0–100.0)
PLATELETS: 129 10*3/uL — AB (ref 150–400)
RBC: 3.81 MIL/uL — ABNORMAL LOW (ref 3.87–5.11)
RDW: 14.7 % (ref 11.5–15.5)
WBC: 7 10*3/uL (ref 4.0–10.5)

## 2014-04-26 LAB — COMPREHENSIVE METABOLIC PANEL
ALBUMIN: 2 g/dL — AB (ref 3.5–5.2)
ALT: 16 U/L (ref 0–35)
AST: 21 U/L (ref 0–37)
Alkaline Phosphatase: 88 U/L (ref 39–117)
Anion gap: 12 (ref 5–15)
BUN: 14 mg/dL (ref 6–23)
CALCIUM: 8.2 mg/dL — AB (ref 8.4–10.5)
CHLORIDE: 104 meq/L (ref 96–112)
CO2: 19 mEq/L (ref 19–32)
CREATININE: 0.74 mg/dL (ref 0.50–1.10)
GFR calc Af Amer: >90 mL/min (ref >90)
Glucose, Bld: 100 mg/dL — ABNORMAL HIGH (ref 70–99)
Potassium: 4.5 mEq/L (ref 3.7–5.3)
Sodium: 135 mEq/L — ABNORMAL LOW (ref 137–147)
Total Bilirubin: 0.3 mg/dL (ref 0.3–1.2)
Total Protein: 5.5 g/dL — ABNORMAL LOW (ref 6.0–8.3)

## 2014-04-26 LAB — PROTEIN / CREATININE RATIO, URINE
CREATININE, URINE: 175.1 mg/dL
Protein Creatinine Ratio: 5.28 — ABNORMAL HIGH (ref 0.00–0.15)
Total Protein, Urine: 924.8 mg/dL

## 2014-04-26 LAB — OB RESULTS CONSOLE GC/CHLAMYDIA
CHLAMYDIA, DNA PROBE: NEGATIVE
Gonorrhea: NEGATIVE

## 2014-04-26 LAB — OB RESULTS CONSOLE HIV ANTIBODY (ROUTINE TESTING): HIV: NONREACTIVE

## 2014-04-26 LAB — OB RESULTS CONSOLE RPR: RPR: NONREACTIVE

## 2014-04-26 LAB — LACTATE DEHYDROGENASE: LDH: 192 U/L (ref 94–250)

## 2014-04-26 LAB — OB RESULTS CONSOLE HEPATITIS B SURFACE ANTIGEN: Hepatitis B Surface Ag: NEGATIVE

## 2014-04-26 LAB — URIC ACID: URIC ACID, SERUM: 7.7 mg/dL — AB (ref 2.4–7.0)

## 2014-04-26 LAB — OB RESULTS CONSOLE ABO/RH: RH Type: NEGATIVE

## 2014-04-26 MED ORDER — CALCIUM CARBONATE ANTACID 500 MG PO CHEW
2.0000 | CHEWABLE_TABLET | ORAL | Status: DC | PRN
  Administered 2014-04-27 – 2014-04-28 (×2): 400 mg via ORAL
  Filled 2014-04-26: qty 1
  Filled 2014-04-26: qty 2
  Filled 2014-04-26: qty 1

## 2014-04-26 MED ORDER — SODIUM CHLORIDE 0.9 % IJ SOLN: 3.0000 mL | Freq: Two times a day (BID) | INTRAMUSCULAR | Status: DC

## 2014-04-26 MED ORDER — LACTATED RINGERS IV SOLN
INTRAVENOUS | Status: DC
  Administered 2014-04-26 – 2014-04-28 (×4): via INTRAVENOUS

## 2014-04-26 MED ORDER — ZOLPIDEM TARTRATE 5 MG PO TABS: 5.0000 mg | ORAL_TABLET | Freq: Every evening | ORAL | Status: DC | PRN

## 2014-04-26 MED ORDER — LABETALOL HCL 100 MG PO TABS
200.0000 mg | ORAL_TABLET | Freq: Two times a day (BID) | ORAL | Status: DC
  Administered 2014-04-26: 200 mg via ORAL
  Filled 2014-04-26: qty 2

## 2014-04-26 MED ORDER — BETAMETHASONE SOD PHOS & ACET 6 (3-3) MG/ML IJ SUSP: 12.0000 mg | INTRAMUSCULAR | Status: DC

## 2014-04-26 MED ORDER — SERTRALINE HCL 50 MG PO TABS
50.0000 mg | ORAL_TABLET | Freq: Every day | ORAL | Status: DC
  Administered 2014-04-26 – 2014-04-27 (×2): 50 mg via ORAL
  Filled 2014-04-26 (×4): qty 1

## 2014-04-26 MED ORDER — MAGNESIUM SULFATE BOLUS VIA INFUSION: 6.0000 g | Freq: Once | INTRAVENOUS | Status: DC

## 2014-04-26 MED ORDER — PRENATAL MULTIVITAMIN CH
1.0000 | ORAL_TABLET | Freq: Every day | ORAL | Status: DC
  Administered 2014-04-28: 1 via ORAL
  Filled 2014-04-26: qty 1

## 2014-04-26 MED ORDER — ACETAMINOPHEN 325 MG PO TABS
650.0000 mg | ORAL_TABLET | ORAL | Status: DC | PRN
  Administered 2014-04-27 – 2014-04-28 (×4): 650 mg via ORAL
  Filled 2014-04-26 (×4): qty 2

## 2014-04-26 MED ORDER — DOCUSATE SODIUM 100 MG PO CAPS
100.0000 mg | ORAL_CAPSULE | Freq: Every day | ORAL | Status: DC
  Administered 2014-04-26 – 2014-04-28 (×2): 100 mg via ORAL
  Filled 2014-04-26 (×2): qty 1

## 2014-04-26 MED ORDER — MAGNESIUM SULFATE 40 G IN LACTATED RINGERS - SIMPLE: 2.0000 g/h | INTRAVENOUS | Status: DC

## 2014-04-26 MED ORDER — SODIUM CHLORIDE 0.9 % IV SOLN: 250.0000 mL | INTRAVENOUS | Status: DC | PRN

## 2014-04-26 MED ORDER — SODIUM CHLORIDE 0.9 % IJ SOLN
3.0000 mL | INTRAMUSCULAR | Status: DC | PRN
  Administered 2014-04-26 – 2014-04-27 (×2): 3 mL via INTRAVENOUS

## 2014-04-26 MED ORDER — LABETALOL HCL 100 MG PO TABS: 200.0000 mg | ORAL_TABLET | Freq: Two times a day (BID) | ORAL | Status: DC

## 2014-04-26 NOTE — Progress Notes (Signed)
Dr Rudi Coco from [facility] called and requested for consult.

## 2014-04-26 NOTE — Progress Notes (Signed)
EFM applied around 1600 but had been recording under [place] pt discharged from room 153.

## 2014-04-26 NOTE — H&P (Signed)
Diana Simpson is a 28 y.o. female presenting to MAU for elevated BPs at home; 170s-190s/100s-110s.  She has been followed in the office for pre-eclampsia since 28 weeks.  She has been closely monitored with fetal testing, labs and twice weekly visits.  She has been asymptomatic.  Thursday, she was started on labetalol 100 bid.  She did receive a steroid course at 28 weeks (8/20 & 21).  On arrival to the MAU, blood pressures were mild to severe range, P:C was 5, and PLT 129.  Antepartum course has additionally been complicated by anxiety which is well-controlled by sertraline.  Last ultrasound was 8/20 with normal growth and fluid.   Maternal Medical History:  Reason for admission: Nausea.  Fetal activity: Perceived fetal activity is normal.   Last perceived fetal movement was within the past hour.    Prenatal complications: Pre-eclampsia and thrombocytopenia.   Prenatal Complications - Diabetes: none.    OB History   Grav Para Term Preterm Abortions TAB SAB Ect Mult Living   1         0     Past Medical History  Diagnosis Date  . Medical history non-contributory   . Anxiety   . Depression    Past Surgical History  Procedure Laterality Date  . No past surgeries     Family History: family history is not on file. Social History:  reports that she has never smoked. She has never used smokeless tobacco. She reports that she does not drink alcohol or use illicit drugs.   Prenatal Transfer Tool  Maternal Diabetes: No Genetic Screening: Normal Maternal Ultrasounds/Referrals: Normal Fetal Ultrasounds or other Referrals:  Referred to Materal Fetal Medicine  Maternal Substance Abuse:  No Significant Maternal Medications:  Meds include: Other: sertraline, labetalol Significant Maternal Lab Results:  None Other Comments:  None  Review of Systems  Constitutional: Negative for fever and chills.  Eyes: Negative for blurred vision and double vision.  Respiratory: Negative for cough and  shortness of breath.   Cardiovascular: Negative for chest pain.  Gastrointestinal: Negative for nausea, vomiting and abdominal pain.  Neurological: Negative for headaches.      Blood pressure 158/97, pulse 74, temperature 98.4 F (36.9 C), temperature source Oral, resp. rate 20, height 5\' 1"  (1.549 m), weight 197 lb (89.359 kg), last menstrual period 09/23/2013, SpO2 98.00%. Maternal Exam:  Abdomen: Patient reports no abdominal tenderness. Fundal height is c/w dates.       Physical Exam  Constitutional: She is oriented to person, place, and time. She appears well-developed and well-nourished.  GI: Soft. There is no rebound and no guarding.  Neurological: She is alert and oriented to person, place, and time. She has normal reflexes.  Skin: Skin is warm and dry.  Psychiatric: She has a normal mood and affect. Her behavior is normal.    Prenatal labs: ABO, Rh: --/--/A NEG (09/06 1610) Antibody: POS (09/06 1610) Rubella: Immune (09/06 1607) RPR: Nonreactive (09/06 1607)  HBsAg: Negative (09/06 1607)  HIV: Non-reactive (09/06 1607)  GBS:     Assessment/Plan: 28 yo G1 at [redacted]w[redacted]d with pre-eclampsia -Serial BPs, daily weights, I&Os -Collect 24 hour urine -Serial HELLP labs -s/p BMZ -Mag for imminent delivery -NST q shift -U/s tomorrow for BPP and Doppler's -Continue labetalol and increase as needed -SCDs -NICU consult  Madelon Welsch, [place] 04/26/2014, 8:38 PM

## 2014-04-26 NOTE — MAU Note (Signed)
Pt states taking bp's at home. Denies spots or blurred vision. Head felt slight pressure earlier however none present. Denies bleeding or lof.

## 2014-04-26 NOTE — MAU Note (Signed)
Pt to go to room 153 Antenatal. RGagnon, RN charge to start [place].

## 2014-04-26 NOTE — MAU Provider Note (Signed)
History     CSN: [id number]  Arrival date and time: 04/26/14 1151   First Provider Initiated Contact with Patient 04/26/14 1242      Chief Complaint  Patient presents with  . Hypertension   HPI  Diana Simpson is a 28 y.o.female G1P0 at 77w5dwho presents with elevated BP readings. The patient has preeclampsia and has been to MAU for elevated BP readings in the last few weeks. She called her Dr. OGabriel Carinafollowing an elevated BP reading at home and she was instructed to come in. +fht, denies vaginal bleeding or leaking of fluid. Pt started taking labetalol 3 days ago; 100 mg twice a day.   OB History   Grav Para Term Preterm Abortions TAB SAB Ect Mult Living   1         0      Past Medical History  Diagnosis Date  . Medical history non-contributory   . Anxiety   . Depression     Past Surgical History  Procedure Laterality Date  . No past surgeries      History reviewed. No pertinent family history.  History  Substance Use Topics  . Smoking status: Never Smoker   . Smokeless tobacco: Never Used  . Alcohol Use: No    Allergies:  Allergies  Allergen Reactions  . Sulfa Antibiotics Other (See Comments)    Childhood reaction    Prescriptions prior to admission  Medication Sig Dispense Refill  . Doxylamine-Pyridoxine (DICLEGIS PO) Take 1 tablet by mouth daily.      .Marland KitchenOVER THE COUNTER MEDICATION Take 1 tablet by mouth daily. Patient takes Nexium 24 over the counter      . Prenatal Vit-Fe Fumarate-FA (PRENATAL MULTIVITAMIN) TABS tablet Take 1 tablet by mouth daily at 12 noon.      . ranitidine (ZANTAC) 150 MG tablet Take 150 mg by mouth 2 (two) times daily.      . sertraline (ZOLOFT) 25 MG tablet Take 25 mg by mouth daily.       Results for orders placed during the hospital encounter of 04/26/14 (from the past 48 hour(s))  URINALYSIS, ROUTINE W REFLEX MICROSCOPIC     Status: Abnormal   Collection Time    04/26/14 12:05 PM      Result Value Ref Range   Color,  Urine YELLOW  YELLOW   APPearance CLEAR  CLEAR   Specific Gravity, Urine 1.020  1.005 - 1.030   pH 7.0  5.0 - 8.0   Glucose, UA NEGATIVE  NEGATIVE mg/dL   Hgb urine dipstick NEGATIVE  NEGATIVE   Bilirubin Urine NEGATIVE  NEGATIVE   Ketones, ur NEGATIVE  NEGATIVE mg/dL   Protein, ur >300 (*) NEGATIVE mg/dL   Urobilinogen, UA 0.2  0.0 - 1.0 mg/dL   Nitrite NEGATIVE  NEGATIVE   Leukocytes, UA NEGATIVE  NEGATIVE  PROTEIN / CREATININE RATIO, URINE     Status: Abnormal   Collection Time    04/26/14 12:05 PM      Result Value Ref Range   Creatinine, Urine 175.10     Total Protein, Urine 924.8     Comment: NO NORMAL RANGE ESTABLISHED FOR THIS TEST     RESULTS CONFIRMED BY MANUAL DILUTION   PROTEIN CREATININE RATIO 5.28 (*) 0.00 - 0.15  URINE MICROSCOPIC-ADD ON     Status: Abnormal   Collection Time    04/26/14 12:05 PM      Result Value Ref Range   Squamous Epithelial / LPF  FEW (*) RARE   WBC, UA 3-6  <3 WBC/hpf   RBC / HPF 0-2  <3 RBC/hpf   Bacteria, UA MANY (*) RARE   Casts HYALINE CASTS (*) NEGATIVE   Urine-Other MUCOUS PRESENT    CBC     Status: Abnormal   Collection Time    04/26/14 12:28 PM      Result Value Ref Range   WBC 7.0  4.0 - 10.5 K/uL   RBC 3.81 (*) 3.87 - 5.11 MIL/uL   Hemoglobin 11.3 (*) 12.0 - 15.0 g/dL   HCT 33.3 (*) 36.0 - 46.0 %   MCV 87.4  78.0 - 100.0 fL   MCH 29.7  26.0 - 34.0 pg   MCHC 33.9  30.0 - 36.0 g/dL   RDW 14.7  11.5 - 15.5 %   Platelets 129 (*) 150 - 400 K/uL  COMPREHENSIVE METABOLIC PANEL     Status: Abnormal   Collection Time    04/26/14 12:28 PM      Result Value Ref Range   Sodium 135 (*) 137 - 147 mEq/L   Potassium 4.5  3.7 - 5.3 mEq/L   Chloride 104  96 - 112 mEq/L   CO2 19  19 - 32 mEq/L   Glucose, Bld 100 (*) 70 - 99 mg/dL   BUN 14  6 - 23 mg/dL   Creatinine, Ser 0.74  0.50 - 1.10 mg/dL   Calcium 8.2 (*) 8.4 - 10.5 mg/dL   Total Protein 5.5 (*) 6.0 - 8.3 g/dL   Albumin 2.0 (*) 3.5 - 5.2 g/dL   AST 21  0 - 37 U/L   ALT 16  0  - 35 U/L   Alkaline Phosphatase 88  39 - 117 U/L   Total Bilirubin 0.3  0.3 - 1.2 mg/dL   GFR calc non Af Amer >90  >90 mL/min   GFR calc Af Amer >90  >90 mL/min   Comment: (NOTE)     The eGFR has been calculated using the CKD EPI equation.     This calculation has not been validated in all clinical situations.     eGFR's persistently <90 mL/min signify possible Chronic Kidney     Disease.   Anion gap 12  5 - 15  URIC ACID     Status: Abnormal   Collection Time    04/26/14 12:28 PM      Result Value Ref Range   Uric Acid, Serum 7.7 (*) 2.4 - 7.0 mg/dL  LACTATE DEHYDROGENASE     Status: None   Collection Time    04/26/14 12:28 PM      Result Value Ref Range   LDH 192  94 - 250 U/L     Review of Systems  Eyes: Negative for blurred vision.  Cardiovascular: Negative for chest pain.  Gastrointestinal: Negative for abdominal pain.  Neurological: Negative for dizziness and headaches.   Physical Exam   Blood pressure 146/95, pulse 68, temperature 98.1 F (36.7 C), resp. rate 16, height 5' 1"  (1.549 m), weight 89.359 kg (197 lb), last menstrual period 09/23/2013.  Physical Exam  Constitutional: She is oriented to person, place, and time. She appears well-developed and well-nourished. No distress.  HENT:  Head: Normocephalic.  Eyes: Pupils are equal, round, and reactive to light.  Neck: Neck supple.  Respiratory: Effort normal.  GI: Normal appearance. There is no tenderness. There is no CVA tenderness.  Musculoskeletal: Normal range of motion.       Right ankle:  She exhibits swelling (Non pitting edema ).       Left ankle: She exhibits swelling (Non pitting edema ).  Neurological: She is alert and oriented to person, place, and time. She has normal reflexes.  Skin: Skin is warm and dry. She is not diaphoretic.  Psychiatric: Her behavior is normal.   Fetal Tracing: Baseline: 135 bpm Variability: moderate  Accelerations: 15x15 Decelerations: none Toco: None  MAU Course   Procedures None  MDM UA Protein/Creat urine CBC Uric acid LDH CMP Discussed lab findings and physical exam with Dr. Lynnette Caffey.  Discussed plan of care with the patient.   Assessment and Plan   A:  1. Preeclampsia, second trimester    P:  Admit to Antenatal unit per Dr. Daiva Huge Mechele Claude, NP 04/26/2014, 12:44 PM

## 2014-04-26 NOTE — MAU Note (Signed)
Pt presents to MAU for high blood pressure. She has been told to check her blood pressures at home and it was elevated this morning. Reports some mild headache today, denies any blurred vision.

## 2014-04-27 LAB — CBC
HCT: 32.3 % — ABNORMAL LOW (ref 36.0–46.0)
HCT: 33.6 % — ABNORMAL LOW (ref 36.0–46.0)
Hemoglobin: 11 g/dL — ABNORMAL LOW (ref 12.0–15.0)
Hemoglobin: 11.5 g/dL — ABNORMAL LOW (ref 12.0–15.0)
MCH: 29.7 pg (ref 26.0–34.0)
MCH: 29.9 pg (ref 26.0–34.0)
MCHC: 34.1 g/dL (ref 30.0–36.0)
MCHC: 34.2 g/dL (ref 30.0–36.0)
MCV: 87.3 fL (ref 78.0–100.0)
MCV: 87.5 fL (ref 78.0–100.0)
PLATELETS: 116 10*3/uL — AB (ref 150–400)
Platelets: 130 10*3/uL — ABNORMAL LOW (ref 150–400)
RBC: 3.7 MIL/uL — AB (ref 3.87–5.11)
RBC: 3.84 MIL/uL — AB (ref 3.87–5.11)
RDW: 14.8 % (ref 11.5–15.5)
RDW: 14.8 % (ref 11.5–15.5)
WBC: 8.7 10*3/uL (ref 4.0–10.5)
WBC: 8.7 10*3/uL (ref 4.0–10.5)

## 2014-04-27 LAB — COMPREHENSIVE METABOLIC PANEL
ALT: 17 U/L (ref 0–35)
AST: 20 U/L (ref 0–37)
Albumin: 1.9 g/dL — ABNORMAL LOW (ref 3.5–5.2)
Alkaline Phosphatase: 92 U/L (ref 39–117)
Anion gap: 10 (ref 5–15)
BUN: 16 mg/dL (ref 6–23)
CALCIUM: 8.3 mg/dL — AB (ref 8.4–10.5)
CHLORIDE: 108 meq/L (ref 96–112)
CO2: 22 meq/L (ref 19–32)
Creatinine, Ser: 0.78 mg/dL (ref 0.50–1.10)
GFR calc Af Amer: >90 mL/min (ref >90)
GFR calc non Af Amer: >90 mL/min (ref >90)
Glucose, Bld: 85 mg/dL (ref 70–99)
POTASSIUM: 4.7 meq/L (ref 3.7–5.3)
Sodium: 140 mEq/L (ref 137–147)
Total Protein: 4.9 g/dL — ABNORMAL LOW (ref 6.0–8.3)

## 2014-04-27 MED ORDER — LABETALOL HCL 100 MG PO TABS
200.0000 mg | ORAL_TABLET | Freq: Once | ORAL | Status: AC
  Administered 2014-04-27: 200 mg via ORAL
  Filled 2014-04-27: qty 2

## 2014-04-27 MED ORDER — PANTOPRAZOLE SODIUM 40 MG PO TBEC
40.0000 mg | DELAYED_RELEASE_TABLET | Freq: Every day | ORAL | Status: DC
  Administered 2014-04-27 – 2014-04-28 (×2): 40 mg via ORAL
  Filled 2014-04-27 (×2): qty 1

## 2014-04-27 MED ORDER — LABETALOL HCL 200 MG PO TABS
400.0000 mg | ORAL_TABLET | Freq: Three times a day (TID) | ORAL | Status: DC
  Administered 2014-04-27 – 2014-04-28 (×5): 400 mg via ORAL
  Filled 2014-04-27 (×4): qty 2
  Filled 2014-04-27: qty 4
  Filled 2014-04-27 (×4): qty 2

## 2014-04-27 MED ORDER — LABETALOL HCL 100 MG PO TABS: 200.0000 mg | ORAL_TABLET | Freq: Three times a day (TID) | ORAL | Status: DC

## 2014-04-27 MED ORDER — MAGNESIUM SULFATE BOLUS VIA INFUSION
4.0000 g | Freq: Once | INTRAVENOUS | Status: AC
  Administered 2014-04-27: 4 g via INTRAVENOUS
  Filled 2014-04-27: qty 500

## 2014-04-27 MED ORDER — MAGNESIUM SULFATE 40 G IN LACTATED RINGERS - SIMPLE
2.0000 g/h | INTRAVENOUS | Status: DC
  Administered 2014-04-28: 2 g/h via INTRAVENOUS
  Filled 2014-04-27 (×2): qty 500

## 2014-04-27 NOTE — Progress Notes (Signed)
Ur chart review completed.  

## 2014-04-27 NOTE — Consult Note (Signed)
Neonatology Consult  Note:  At the request of the patients obstetrician Dr. Julien Girt I met with Diana Simpson and her husband.  She is currently 30 6 weeks with pregnancy complicated by severe preeclampsia.  She has been followed as an outpatient for pre-eclampsia since 28 weeks with fetal testing, labs and twice weekly visits. She was recently started on labetalol 100 bid (9/3).  She has received a steroid course at 28 weeks (8/20 & 21).  Antepartum course has additionally been complicated by anxiety which is well-controlled by sertraline. Last ultrasound was 8/20 with normal growth and fluid.  We reviewed initial delivery room management, including CPAP, [place], and low but certainly possible need for intubation for surfactant administration.  We discussed feeding immaturity and need for full po intake with multiple days of good weight gain and no apnea or bradycardia before discharge.  We reviewed increased risk of jaundice, infection, and temperature instability.   Discussed likely length of stay.  Thank you for allowing [place] to participate in her care.   Higinio Roger, DO  Neonatologist  The total length of face-to-face or floor / unit time for this encounter was 30 minutes.  Counseling and / or coordination of care was greater than fifty percent of the time.

## 2014-04-27 NOTE — Progress Notes (Addendum)
Pt c/o mild HA.  No n/v or visual changes.  Good FM  BP 170s/90s Gen - NAD Abd - gravid, NT Ext - NT, no edema.  SCDs PV - deferred  CBC - plts 116 (decreased from 129)  A/P:  Pre-eclampsia with severe features Continue labetalol - just started 400mg  tid this am Plan NICU consult today Mag for neuroprotection S/p BMZ Rpt CBC at noon D/W pt and husband if BP remain high and plts continue to drop will need delivery today

## 2014-04-27 NOTE — Progress Notes (Signed)
Pt without complaints  BP 130-140/80s Rpt platelet count 130  A/P:  Continue labetalol mag for CP prophylaxis Rpt labs in am Consider MFM consult in am

## 2014-04-28 ENCOUNTER — Encounter (HOSPITAL_COMMUNITY): Payer: Managed Care, Other (non HMO) | Admitting: Anesthesiology

## 2014-04-28 ENCOUNTER — Encounter (HOSPITAL_COMMUNITY): Payer: Self-pay | Admitting: Obstetrics and Gynecology

## 2014-04-28 ENCOUNTER — Inpatient Hospital Stay (HOSPITAL_COMMUNITY): Payer: Managed Care, Other (non HMO) | Admitting: Anesthesiology

## 2014-04-28 ENCOUNTER — Inpatient Hospital Stay (HOSPITAL_COMMUNITY): Payer: Managed Care, Other (non HMO)

## 2014-04-28 ENCOUNTER — Encounter (HOSPITAL_COMMUNITY)
Admission: AD | Disposition: A | Discharge status: Home / Self Care | Payer: Self-pay | Source: Ambulatory Visit | Attending: Obstetrics and Gynecology

## 2014-04-28 DIAGNOSIS — O1490 Unspecified pre-eclampsia, unspecified trimester: Secondary | ICD-10-CM | POA: Diagnosis present

## 2014-04-28 LAB — COMPREHENSIVE METABOLIC PANEL
ALK PHOS: 88 U/L (ref 39–117)
ALT: 16 U/L (ref 0–35)
AST: 21 U/L (ref 0–37)
Albumin: 1.8 g/dL — ABNORMAL LOW (ref 3.5–5.2)
Anion gap: 11 (ref 5–15)
BUN: 13 mg/dL (ref 6–23)
CALCIUM: 7 mg/dL — AB (ref 8.4–10.5)
CO2: 21 mEq/L (ref 19–32)
Chloride: 101 mEq/L (ref 96–112)
Creatinine, Ser: 0.74 mg/dL (ref 0.50–1.10)
GFR calc non Af Amer: >90 mL/min (ref >90)
GLUCOSE: 101 mg/dL — AB (ref 70–99)
POTASSIUM: 4.6 meq/L (ref 3.7–5.3)
SODIUM: 133 meq/L — AB (ref 137–147)
Total Bilirubin: <0.2 mg/dL — ABNORMAL LOW (ref 0.3–1.2)
Total Protein: 4.9 g/dL — ABNORMAL LOW (ref 6.0–8.3)

## 2014-04-28 LAB — PROTEIN, URINE, 24 HOUR
Collection Interval-UPROT: 24 hours
PROTEIN 24H UR: 5130 mg/d — AB (ref <150)
PROTEIN, URINE: 360 mg/dL — AB (ref 5–24)
Urine Total Volume-UPROT: 1425 mL

## 2014-04-28 LAB — CBC
HCT: 31.4 % — ABNORMAL LOW (ref 36.0–46.0)
HCT: 31.4 % — ABNORMAL LOW (ref 36.0–46.0)
HCT: 30.1 % — ABNORMAL LOW (ref 36.0–46.0)
HEMOGLOBIN: 10.9 g/dL — AB (ref 12.0–15.0)
Hemoglobin: 10.7 g/dL — ABNORMAL LOW (ref 12.0–15.0)
Hemoglobin: 10.4 g/dL — ABNORMAL LOW (ref 12.0–15.0)
MCH: 29.9 pg (ref 26.0–34.0)
MCH: 29.9 pg (ref 26.0–34.0)
MCH: 29.8 pg (ref 26.0–34.0)
MCHC: 34.1 g/dL (ref 30.0–36.0)
MCHC: 34.7 g/dL (ref 30.0–36.0)
MCHC: 34.6 g/dL (ref 30.0–36.0)
MCV: 87.7 fL (ref 78.0–100.0)
MCV: 86.3 fL (ref 78.0–100.0)
MCV: 86.2 fL (ref 78.0–100.0)
PLATELETS: 126 10*3/uL — AB (ref 150–400)
PLATELETS: 120 10*3/uL — AB (ref 150–400)
PLATELETS: 119 10*3/uL — AB (ref 150–400)
RBC: 3.58 MIL/uL — ABNORMAL LOW (ref 3.87–5.11)
RBC: 3.64 MIL/uL — ABNORMAL LOW (ref 3.87–5.11)
RBC: 3.49 MIL/uL — AB (ref 3.87–5.11)
RDW: 14.9 % (ref 11.5–15.5)
RDW: 14.8 % (ref 11.5–15.5)
RDW: 14.9 % (ref 11.5–15.5)
WBC: 7.7 10*3/uL (ref 4.0–10.5)
WBC: 7.4 10*3/uL (ref 4.0–10.5)
WBC: 9.3 10*3/uL (ref 4.0–10.5)

## 2014-04-28 SURGERY — Surgical Case
Anesthesia: Epidural | Site: Abdomen

## 2014-04-28 MED ORDER — OXYCODONE-ACETAMINOPHEN 5-325 MG PO TABS
1.0000 | ORAL_TABLET | ORAL | Status: DC | PRN
  Administered 2014-04-29 – 2014-05-02 (×12): 1 via ORAL
  Filled 2014-04-28 (×11): qty 1

## 2014-04-28 MED ORDER — DIPHENHYDRAMINE HCL 25 MG PO CAPS: 25.0000 mg | ORAL_CAPSULE | ORAL | Status: DC | PRN

## 2014-04-28 MED ORDER — KETOROLAC TROMETHAMINE 30 MG/ML IJ SOLN: 30.0000 mg | Freq: Four times a day (QID) | INTRAMUSCULAR | Status: DC | PRN

## 2014-04-28 MED ORDER — OXYTOCIN 40 UNITS IN LACTATED RINGERS INFUSION - SIMPLE MED
INTRAVENOUS | Status: DC | PRN
  Administered 2014-04-28: 40 [IU] via INTRAVENOUS

## 2014-04-28 MED ORDER — BUPIVACAINE LIPOSOME 1.3 % IJ SUSP
20.0000 mL | Freq: Once | INTRAMUSCULAR | Status: AC
  Administered 2014-04-28: 20 mL
  Filled 2014-04-28: qty 20

## 2014-04-28 MED ORDER — INFLUENZA VAC SPLIT QUAD 0.5 ML IM SUSY
0.5000 mL | PREFILLED_SYRINGE | INTRAMUSCULAR | Status: DC
  Filled 2014-04-28: qty 0.5

## 2014-04-28 MED ORDER — LACTATED RINGERS IV SOLN
INTRAVENOUS | Status: DC
  Administered 2014-04-29 (×4): via INTRAVENOUS

## 2014-04-28 MED ORDER — MORPHINE SULFATE 0.5 MG/ML IJ SOLN
INTRAMUSCULAR | Status: AC
  Filled 2014-04-28: qty 10

## 2014-04-28 MED ORDER — HYDROMORPHONE HCL PF 1 MG/ML IJ SOLN: 0.2500 mg | INTRAMUSCULAR | Status: DC | PRN

## 2014-04-28 MED ORDER — NALBUPHINE HCL 10 MG/ML IJ SOLN
5.0000 mg | INTRAMUSCULAR | Status: DC | PRN
  Administered 2014-04-29: 5 mg via INTRAVENOUS
  Filled 2014-04-28: qty 1

## 2014-04-28 MED ORDER — MORPHINE SULFATE (PF) 0.5 MG/ML IJ SOLN
INTRAMUSCULAR | Status: DC | PRN
  Administered 2014-04-28: .1 mg via INTRATHECAL

## 2014-04-28 MED ORDER — NALBUPHINE HCL 10 MG/ML IJ SOLN: 5.0000 mg | INTRAMUSCULAR | Status: DC | PRN

## 2014-04-28 MED ORDER — DIBUCAINE 1 % RE OINT: 1.0000 "application " | TOPICAL_OINTMENT | RECTAL | Status: DC | PRN

## 2014-04-28 MED ORDER — SODIUM CHLORIDE 0.9 % IJ SOLN: 3.0000 mL | INTRAMUSCULAR | Status: DC | PRN

## 2014-04-28 MED ORDER — MEPERIDINE HCL 25 MG/ML IJ SOLN: 6.2500 mg | INTRAMUSCULAR | Status: DC | PRN

## 2014-04-28 MED ORDER — OXYTOCIN 40 UNITS IN LACTATED RINGERS INFUSION - SIMPLE MED: 62.5000 mL/h | INTRAVENOUS | Status: AC

## 2014-04-28 MED ORDER — ONDANSETRON HCL 4 MG/2ML IJ SOLN
INTRAMUSCULAR | Status: DC | PRN
  Administered 2014-04-28: 4 mg via INTRAVENOUS

## 2014-04-28 MED ORDER — SIMETHICONE 80 MG PO CHEW: 80.0000 mg | CHEWABLE_TABLET | ORAL | Status: DC | PRN

## 2014-04-28 MED ORDER — IBUPROFEN 600 MG PO TABS
600.0000 mg | ORAL_TABLET | Freq: Four times a day (QID) | ORAL | Status: DC
  Administered 2014-04-29 – 2014-05-02 (×15): 600 mg via ORAL
  Filled 2014-04-28 (×15): qty 1

## 2014-04-28 MED ORDER — SIMETHICONE 80 MG PO CHEW
80.0000 mg | CHEWABLE_TABLET | Freq: Three times a day (TID) | ORAL | Status: DC
  Administered 2014-04-29 – 2014-05-02 (×8): 80 mg via ORAL
  Filled 2014-04-28 (×8): qty 1

## 2014-04-28 MED ORDER — PHENYLEPHRINE 8 MG IN D5W 100 ML (0.08MG/ML) PREMIX OPTIME
INJECTION | INTRAVENOUS | Status: AC
  Filled 2014-04-28: qty 100

## 2014-04-28 MED ORDER — LACTATED RINGERS IV SOLN
INTRAVENOUS | Status: DC | PRN
  Administered 2014-04-28: 20:00:00 via INTRAVENOUS

## 2014-04-28 MED ORDER — ZOLPIDEM TARTRATE 5 MG PO TABS: 5.0000 mg | ORAL_TABLET | Freq: Every evening | ORAL | Status: DC | PRN

## 2014-04-28 MED ORDER — CITRIC ACID-SODIUM CITRATE 334-500 MG/5ML PO SOLN
30.0000 mL | Freq: Once | ORAL | Status: AC
  Administered 2014-04-28: 30 mL via ORAL

## 2014-04-28 MED ORDER — SERTRALINE HCL 25 MG PO TABS
25.0000 mg | ORAL_TABLET | Freq: Every day | ORAL | Status: DC
  Administered 2014-04-29 – 2014-05-02 (×4): 25 mg via ORAL
  Filled 2014-04-28 (×5): qty 1

## 2014-04-28 MED ORDER — ACETAMINOPHEN 160 MG/5ML PO SOLN: 975.0000 mg | Freq: Four times a day (QID) | ORAL | Status: DC | PRN

## 2014-04-28 MED ORDER — MAGNESIUM SULFATE BOLUS VIA INFUSION
4.0000 g | Freq: Once | INTRAVENOUS | Status: AC
  Administered 2014-04-28: 4 g via INTRAVENOUS
  Filled 2014-04-28: qty 500

## 2014-04-28 MED ORDER — OXYTOCIN 10 UNIT/ML IJ SOLN
INTRAMUSCULAR | Status: AC
  Filled 2014-04-28: qty 4

## 2014-04-28 MED ORDER — MAGNESIUM SULFATE 40 G IN LACTATED RINGERS - SIMPLE
2.0000 g/h | INTRAVENOUS | Status: DC
  Administered 2014-04-28 – 2014-04-29 (×2): 2 g/h via INTRAVENOUS
  Filled 2014-04-28 (×2): qty 500

## 2014-04-28 MED ORDER — SODIUM CHLORIDE 0.9 % IJ SOLN
INTRAMUSCULAR | Status: AC
  Filled 2014-04-28: qty 20

## 2014-04-28 MED ORDER — SENNOSIDES-DOCUSATE SODIUM 8.6-50 MG PO TABS
2.0000 | ORAL_TABLET | ORAL | Status: DC
  Administered 2014-04-29 – 2014-05-01 (×4): 2 via ORAL
  Filled 2014-04-28 (×4): qty 2

## 2014-04-28 MED ORDER — SCOPOLAMINE 1 MG/3DAYS TD PT72
MEDICATED_PATCH | TRANSDERMAL | Status: AC
  Filled 2014-04-28: qty 1

## 2014-04-28 MED ORDER — SCOPOLAMINE 1 MG/3DAYS TD PT72
1.0000 | MEDICATED_PATCH | Freq: Once | TRANSDERMAL | Status: AC
  Administered 2014-04-28: 1.5 mg via TRANSDERMAL

## 2014-04-28 MED ORDER — FENTANYL CITRATE 0.05 MG/ML IJ SOLN
INTRAMUSCULAR | Status: AC
  Filled 2014-04-28: qty 2

## 2014-04-28 MED ORDER — ONDANSETRON HCL 4 MG/2ML IJ SOLN: 4.0000 mg | INTRAMUSCULAR | Status: DC | PRN

## 2014-04-28 MED ORDER — DIPHENHYDRAMINE HCL 50 MG/ML IJ SOLN
12.5000 mg | INTRAMUSCULAR | Status: DC | PRN
  Administered 2014-04-29: 12.5 mg via INTRAVENOUS

## 2014-04-28 MED ORDER — ONDANSETRON HCL 4 MG PO TABS: 4.0000 mg | ORAL_TABLET | ORAL | Status: DC | PRN

## 2014-04-28 MED ORDER — CEFAZOLIN SODIUM-DEXTROSE 2-3 GM-% IV SOLR
2.0000 g | INTRAVENOUS | Status: DC
Start: 2014-04-28 — End: 2014-04-28

## 2014-04-28 MED ORDER — OXYCODONE-ACETAMINOPHEN 5-325 MG PO TABS
2.0000 | ORAL_TABLET | ORAL | Status: DC | PRN
  Filled 2014-04-28: qty 2

## 2014-04-28 MED ORDER — PHENYLEPHRINE 40 MCG/ML (10ML) SYRINGE FOR IV PUSH (FOR BLOOD PRESSURE SUPPORT)
PREFILLED_SYRINGE | INTRAVENOUS | Status: AC
  Filled 2014-04-28: qty 5

## 2014-04-28 MED ORDER — TETANUS-DIPHTH-ACELL PERTUSSIS 5-2.5-18.5 LF-MCG/0.5 IM SUSP
0.5000 mL | Freq: Once | INTRAMUSCULAR | Status: DC
Start: 2014-04-29 — End: 2014-05-02
  Filled 2014-04-28: qty 0.5

## 2014-04-28 MED ORDER — DIPHENHYDRAMINE HCL 50 MG/ML IJ SOLN
25.0000 mg | INTRAMUSCULAR | Status: DC | PRN
  Filled 2014-04-28: qty 1

## 2014-04-28 MED ORDER — WITCH HAZEL-GLYCERIN EX PADS: 1.0000 "application " | MEDICATED_PAD | CUTANEOUS | Status: DC | PRN

## 2014-04-28 MED ORDER — CITRIC ACID-SODIUM CITRATE 334-500 MG/5ML PO SOLN
ORAL | Status: AC
  Administered 2014-04-28: 30 mL via ORAL
  Filled 2014-04-28: qty 15

## 2014-04-28 MED ORDER — DIPHENHYDRAMINE HCL 25 MG PO CAPS: 25.0000 mg | ORAL_CAPSULE | Freq: Four times a day (QID) | ORAL | Status: DC | PRN

## 2014-04-28 MED ORDER — NALOXONE HCL 0.4 MG/ML IJ SOLN: 0.4000 mg | INTRAMUSCULAR | Status: DC | PRN

## 2014-04-28 MED ORDER — LANOLIN HYDROUS EX OINT: 1.0000 "application " | TOPICAL_OINTMENT | CUTANEOUS | Status: DC | PRN

## 2014-04-28 MED ORDER — LABETALOL HCL 200 MG PO TABS
200.0000 mg | ORAL_TABLET | Freq: Three times a day (TID) | ORAL | Status: DC
  Administered 2014-04-29 – 2014-05-02 (×11): 200 mg via ORAL
  Filled 2014-04-28 (×14): qty 1

## 2014-04-28 MED ORDER — SIMETHICONE 80 MG PO CHEW
80.0000 mg | CHEWABLE_TABLET | ORAL | Status: DC
  Administered 2014-04-29 – 2014-05-01 (×4): 80 mg via ORAL
  Filled 2014-04-28 (×4): qty 1

## 2014-04-28 MED ORDER — ONDANSETRON HCL 4 MG/2ML IJ SOLN
INTRAMUSCULAR | Status: AC
  Filled 2014-04-28: qty 2

## 2014-04-28 MED ORDER — PRENATAL MULTIVITAMIN CH
1.0000 | ORAL_TABLET | Freq: Every day | ORAL | Status: DC
  Administered 2014-04-29 – 2014-05-02 (×4): 1 via ORAL
  Filled 2014-04-28 (×4): qty 1

## 2014-04-28 MED ORDER — METOCLOPRAMIDE HCL 5 MG/ML IJ SOLN: 10.0000 mg | Freq: Three times a day (TID) | INTRAMUSCULAR | Status: DC | PRN

## 2014-04-28 MED ORDER — LACTATED RINGERS IV SOLN
INTRAVENOUS | Status: DC
  Administered 2014-04-28: 20:00:00 via INTRAVENOUS

## 2014-04-28 MED ORDER — ONDANSETRON HCL 4 MG/2ML IJ SOLN: 4.0000 mg | Freq: Three times a day (TID) | INTRAMUSCULAR | Status: DC | PRN

## 2014-04-28 MED ORDER — CEFAZOLIN SODIUM-DEXTROSE 2-3 GM-% IV SOLR
INTRAVENOUS | Status: DC | PRN
  Administered 2014-04-28: 2 g via INTRAVENOUS

## 2014-04-28 MED ORDER — LACTATED RINGERS IV SOLN
INTRAVENOUS | Status: DC | PRN
  Administered 2014-04-28: 21:00:00 via INTRAVENOUS

## 2014-04-28 MED ORDER — MENTHOL 3 MG MT LOZG
1.0000 | LOZENGE | OROMUCOSAL | Status: DC | PRN
Start: 2014-04-28 — End: 2014-05-02

## 2014-04-28 MED ORDER — SODIUM BICARBONATE 8.4 % IV SOLN
INTRAVENOUS | Status: DC | PRN
  Administered 2014-04-28: 4 mL via EPIDURAL
  Administered 2014-04-28: 3 mL via EPIDURAL

## 2014-04-28 MED ORDER — NALOXONE HCL 1 MG/ML IJ SOLN
1.0000 ug/kg/h | INTRAVENOUS | Status: DC | PRN
  Filled 2014-04-28: qty 2

## 2014-04-28 MED ORDER — PANTOPRAZOLE SODIUM 40 MG PO TBEC: 40.0000 mg | DELAYED_RELEASE_TABLET | Freq: Every day | ORAL | Status: DC

## 2014-04-28 SURGICAL SUPPLY — 34 items
BENZOIN TINCTURE PRP APPL 2/3 (GAUZE/BANDAGES/DRESSINGS) IMPLANT
BLADE SURG 10 STRL SS (BLADE) ×6 IMPLANT
CLAMP CORD UMBIL (MISCELLANEOUS) IMPLANT
CLOSURE WOUND 1/2 X4 (GAUZE/BANDAGES/DRESSINGS)
CLOTH BEACON ORANGE TIMEOUT ST (SAFETY) ×3 IMPLANT
CONTAINER PREFILL 10% NBF 15ML (MISCELLANEOUS) IMPLANT
DERMABOND ADVANCED (GAUZE/BANDAGES/DRESSINGS)
DERMABOND ADVANCED .7 DNX12 (GAUZE/BANDAGES/DRESSINGS) IMPLANT
DRAPE LG THREE QUARTER DISP (DRAPES) IMPLANT
DRSG OPSITE POSTOP 4X10 (GAUZE/BANDAGES/DRESSINGS) ×3 IMPLANT
DURAPREP 26ML APPLICATOR (WOUND CARE) ×3 IMPLANT
ELECT REM PT RETURN 9FT ADLT (ELECTROSURGICAL) ×3
ELECTRODE REM PT RTRN 9FT ADLT (ELECTROSURGICAL) ×1 IMPLANT
EXTRACTOR VACUUM M CUP 4 TUBE (SUCTIONS) IMPLANT
EXTRACTOR VACUUM M CUP 4' TUBE (SUCTIONS)
GLOVE BIO SURGEON STRL SZ7 (GLOVE) ×3 IMPLANT
GOWN STRL REUS W/TWL LRG LVL3 (GOWN DISPOSABLE) ×6 IMPLANT
KIT ABG SYR 3ML LUER SLIP (SYRINGE) ×3 IMPLANT
NEEDLE HYPO 21X1.5 SAFETY (NEEDLE) ×3 IMPLANT
NEEDLE HYPO 25X5/8 SAFETYGLIDE (NEEDLE) ×3 IMPLANT
NS IRRIG 1000ML POUR BTL (IV SOLUTION) ×3 IMPLANT
PACK C SECTION WH (CUSTOM PROCEDURE TRAY) ×3 IMPLANT
PAD OB MATERNITY 4.3X12.25 (PERSONAL CARE ITEMS) ×3 IMPLANT
STAPLER VISISTAT 35W (STAPLE) IMPLANT
STRIP CLOSURE SKIN 1/2X4 (GAUZE/BANDAGES/DRESSINGS) IMPLANT
SUT MNCRL 0 VIOLET CTX 36 (SUTURE) ×4 IMPLANT
SUT MONOCRYL 0 CTX 36 (SUTURE) ×8
SUT PDS AB 0 CTX 60 (SUTURE) ×3 IMPLANT
SUT PLAIN 0 NONE (SUTURE) IMPLANT
SUT VIC AB 4-0 KS 27 (SUTURE) ×3 IMPLANT
SYRINGE 20CC LL (MISCELLANEOUS) ×3 IMPLANT
TOWEL OR 17X24 6PK STRL BLUE (TOWEL DISPOSABLE) ×3 IMPLANT
TRAY FOLEY CATH 14FR (SET/KITS/TRAYS/PACK) ×3 IMPLANT
WATER STERILE IRR 1000ML POUR (IV SOLUTION) ×3 IMPLANT

## 2014-04-28 NOTE — Progress Notes (Signed)
C/O HA and epigastric discomfort Last ate at 4:45pm  BPs 169/97, 164/103, 172/99 Lungs CTA Cor RRR DTR 2+ without clonus  A/P: severe preeclampsia with BPs now rising and HA         I D/W patient and husband my recommendation of delivery via cesarean section         D/W risks including infection, organ damage, bleeding/transfusion-HIV/Hep, DVT/PE, pneumonia         All questions answered         D/W Dr Glennon Mac of anesthesia-in view of above I favor delivery now

## 2014-04-28 NOTE — Anesthesia Postprocedure Evaluation (Signed)
  Anesthesia Post-op Note  Patient: Diana Simpson  Procedure(s) Performed: Procedure(s): CESAREAN SECTION (N/A)  Patient is awake, responsive, moving her legs, and has signs of resolution of her numbness. Pain and nausea are reasonably well controlled. Vital signs are stable and clinically acceptable. Oxygen saturation is clinically acceptable. There are no apparent anesthetic complications at this time. Patient is ready for discharge.

## 2014-04-28 NOTE — Anesthesia Procedure Notes (Signed)
Spinal  Patient location during procedure: OR Preanesthetic Checklist Completed: patient identified, site marked, surgical consent, pre-op evaluation, timeout performed, IV checked, risks and benefits discussed and monitors and equipment checked Spinal Block Patient position: sitting Prep: DuraPrep Patient monitoring: cardiac monitor, continuous pulse ox, blood pressure and heart rate Approach: midline Location: L3-4 Injection technique: catheter Needle Needle type: Tuohy and Sprotte  Needle gauge: 24 G Needle length: 12.7 cm Needle insertion depth: 7 cm Catheter type: closed end flexible Catheter size: 19 g Catheter at skin depth: 12 cm Assessment Sensory level: T8 Additional Notes Spinal Dosage in OR  Bupivicaine ml       1.1 PFMS04   mcg        100

## 2014-04-28 NOTE — Consult Note (Addendum)
Maternal Fetal Medicine Consultation  Requesting Provider(s): Everlene Farrier II, MD  Reason for consultation: Preeclampsia  HPI: Diana Simpson is a 28 year-old G1P0 currently at 41w 0d who was seen for consultation due to preeclampsia.  Diana Simpson has been followed as an outpatient due to elevated blood pressures since approximately 28 weeks.  She completed a course of betamethasone at that time.  Diana Simpson was admitted over Diana weekend when she noted elevated blood pressures to Diana 170's-190's/00-110's.  Her 24-hr urine protein returned with 5130 mg/24 hrs.  Her most recent platelet count was 126K - Diana remainder of her preeclampsia labs have been within normal limits.  Diana Simpson is now on Labetalol 400 mg q 8 hours with most of her blood pressures < 140/90.  She reports a mild headache since starting Magnesium, but is otherwise without complaints.  She denies RUQ pain or visual changes.  Diana fetus is active.    Last ultrasound in clinic was on 8/21 - fetus at Diana 58th %tile. Had UA Doppler studies and BPP over Diana weekend that were both normal.  OB History: OB History   Grav Para Term Preterm Abortions TAB SAB Ect Mult Living   1         0      PMH:  Past Medical History  Diagnosis Date  . Medical history non-contributory   . Anxiety   . Depression     PSH:  Past Surgical History  Procedure Laterality Date  . No past surgeries     Meds:  Scheduled Meds: . docusate sodium  100 mg Oral Daily  . labetalol  400 mg Oral 3 times per day  . [START ON 04/29/2014] pantoprazole  40 mg Oral Daily  . prenatal multivitamin  1 tablet Oral Q1200  . sertraline  50 mg Oral Daily  . sodium chloride  3 mL Intravenous Q12H   Continuous Infusions: . lactated ringers 100 mL/hr at 04/28/14 0432   PRN Meds:.sodium chloride, acetaminophen, calcium carbonate, sodium chloride, zolpidem  Allergies:  Allergies  Allergen Reactions  . Sulfa Antibiotics Other (See Comments)    Childhood reaction    FH: Denies birth defects or hereditary disorders.  Father - HTN, Maternal aunt - CAD, hx of preeclampsia  Soc:  History   Social History  . Marital Status: Married    Spouse Name: N/A    Number of Children: N/A  . Years of Education: N/A   Occupational History  . Not on file.   Social History Main Topics  . Smoking status: Never Smoker   . Smokeless tobacco: Never Used  . Alcohol Use: No  . Drug Use: No  . Sexual Activity: Yes    Birth Control/ Protection: None   Other Topics Concern  . Not on file   Social History Narrative  . No narrative on file    PE:   Filed Vitals:   04/28/14 0900  BP:   Pulse:   Temp:   Resp: 18  BPs: 134/87, 133/77, 152/92, 123/76   GEN: well-appearing female ABD: gravid, NT  Please see separate document for fetal ultrasound report.  Labs:  Recent Results (from Diana past 2160 hour(s))  URINALYSIS, ROUTINE W REFLEX MICROSCOPIC     Status: Abnormal   Collection Time    04/08/14  1:35 PM      Result Value Ref Range   Color, Urine YELLOW  YELLOW   APPearance CLEAR  CLEAR   Specific Gravity, Urine 1.025  1.005 - 1.030   pH 6.5  5.0 - 8.0   Glucose, UA NEGATIVE  NEGATIVE mg/dL   Hgb urine dipstick NEGATIVE  NEGATIVE   Bilirubin Urine NEGATIVE  NEGATIVE   Ketones, ur NEGATIVE  NEGATIVE mg/dL   Protein, ur 100 (*) NEGATIVE mg/dL   Urobilinogen, UA 0.2  0.0 - 1.0 mg/dL   Nitrite NEGATIVE  NEGATIVE   Leukocytes, UA NEGATIVE  NEGATIVE  URINE MICROSCOPIC-ADD ON     Status: Abnormal   Collection Time    04/08/14  1:35 PM      Result Value Ref Range   Squamous Epithelial / LPF FEW (*) RARE   WBC, UA 7-10  <3 WBC/hpf   RBC / HPF 3-6  <3 RBC/hpf   Bacteria, UA MANY (*) RARE   Urine-Other MUCOUS PRESENT    PROTEIN / CREATININE RATIO, URINE     Status: Abnormal   Collection Time    04/08/14  1:35 PM      Result Value Ref Range   Creatinine, Urine 154.36     Total Protein, Urine 98.5     Comment: NO NORMAL RANGE ESTABLISHED FOR  THIS TEST   PROTEIN CREATININE RATIO 0.64 (*) 0.00 - 0.15  URINE CULTURE     Status: None   Collection Time    04/08/14  1:35 PM      Result Value Ref Range   Specimen Description URINE, CLEAN CATCH     Special Requests NONE     Culture  Setup Time       Value: 04/08/2014 19:09     Performed at [facility] Count       Value: 40,000 COLONIES/ML     Performed at [facility]   Culture       Value: Multiple bacterial morphotypes present, none predominant. Suggest appropriate recollection if clinically indicated.     Performed at [facility]   Report Status 04/09/2014 FINAL    CBC     Status: Abnormal   Collection Time    04/08/14  2:09 PM      Result Value Ref Range   WBC 9.5  4.0 - 10.5 K/uL   RBC 4.10  3.87 - 5.11 MIL/uL   Hemoglobin 12.2  12.0 - 15.0 g/dL   HCT 35.4 (*) 36.0 - 46.0 %   MCV 86.3  78.0 - 100.0 fL   MCH 29.8  26.0 - 34.0 pg   MCHC 34.5  30.0 - 36.0 g/dL   RDW 14.4  11.5 - 15.5 %   Platelets 162  150 - 400 K/uL  COMPREHENSIVE METABOLIC PANEL     Status: Abnormal   Collection Time    04/08/14  2:09 PM      Result Value Ref Range   Sodium 137  137 - 147 mEq/L   Potassium 4.9  3.7 - 5.3 mEq/L   Chloride 101  96 - 112 mEq/L   CO2 23  19 - 32 mEq/L   Glucose, Bld 72  70 - 99 mg/dL   BUN 9  6 - 23 mg/dL   Creatinine, Ser 0.67  0.50 - 1.10 mg/dL   Calcium 8.7  8.4 - 10.5 mg/dL   Total Protein 6.0  6.0 - 8.3 g/dL   Albumin 2.9 (*) 3.5 - 5.2 g/dL   AST 17  0 - 37 U/L   ALT 9  0 - 35 U/L   Alkaline Phosphatase 89  39 - 117  U/L   Total Bilirubin 0.3  0.3 - 1.2 mg/dL   GFR calc non Af Amer >90  >90 mL/min   GFR calc Af Amer >90  >90 mL/min   Comment: (NOTE)     Diana eGFR has been calculated using Diana CKD EPI equation.     This calculation has not been validated in all clinical situations.     eGFR's persistently <90 mL/min signify possible Chronic Kidney     Disease.   Anion gap 13  5 - 15  URIC ACID     Status: None    Collection Time    04/08/14  2:09 PM      Result Value Ref Range   Uric Acid, Serum 5.6  2.4 - 7.0 mg/dL  LACTATE DEHYDROGENASE     Status: None   Collection Time    04/08/14  2:09 PM      Result Value Ref Range   LDH 202  94 - 250 U/L  PROTEIN / CREATININE RATIO, URINE     Status: Abnormal   Collection Time    04/09/14  3:45 PM      Result Value Ref Range   Creatinine, Urine 295.18     Total Protein, Urine 174.9     Comment: NO NORMAL RANGE ESTABLISHED FOR THIS TEST   PROTEIN CREATININE RATIO 0.59 (*) 0.00 - 0.15  URINALYSIS, ROUTINE W REFLEX MICROSCOPIC     Status: Abnormal   Collection Time    04/09/14  3:45 PM      Result Value Ref Range   Color, Urine YELLOW  YELLOW   APPearance CLOUDY (*) CLEAR   Specific Gravity, Urine >1.030 (*) 1.005 - 1.030   pH 6.0  5.0 - 8.0   Glucose, UA NEGATIVE  NEGATIVE mg/dL   Hgb urine dipstick NEGATIVE  NEGATIVE   Bilirubin Urine NEGATIVE  NEGATIVE   Ketones, ur 15 (*) NEGATIVE mg/dL   Protein, ur 100 (*) NEGATIVE mg/dL   Urobilinogen, UA 0.2  0.0 - 1.0 mg/dL   Nitrite NEGATIVE  NEGATIVE   Leukocytes, UA NEGATIVE  NEGATIVE  URINE MICROSCOPIC-ADD ON     Status: Abnormal   Collection Time    04/09/14  3:45 PM      Result Value Ref Range   Squamous Epithelial / LPF MANY (*) RARE   WBC, UA 0-2  <3 WBC/hpf   RBC / HPF 0-2  <3 RBC/hpf   Urine-Other AMORPHOUS URATES/PHOSPHATES    CBC     Status: Abnormal   Collection Time    04/09/14  4:45 PM      Result Value Ref Range   WBC 10.5  4.0 - 10.5 K/uL   RBC 3.97  3.87 - 5.11 MIL/uL   Hemoglobin 11.9 (*) 12.0 - 15.0 g/dL   HCT 34.4 (*) 36.0 - 46.0 %   MCV 86.6  78.0 - 100.0 fL   MCH 30.0  26.0 - 34.0 pg   MCHC 34.6  30.0 - 36.0 g/dL   RDW 14.5  11.5 - 15.5 %   Platelets 166  150 - 400 K/uL  COMPREHENSIVE METABOLIC PANEL     Status: Abnormal   Collection Time    04/09/14  4:45 PM      Result Value Ref Range   Sodium 134 (*) 137 - 147 mEq/L   Potassium 4.6  3.7 - 5.3 mEq/L   Chloride  102  96 - 112 mEq/L   CO2 23  19 - 32 mEq/L  Glucose, Bld 95  70 - 99 mg/dL   BUN 12  6 - 23 mg/dL   Creatinine, Ser 0.69  0.50 - 1.10 mg/dL   Calcium 8.4  8.4 - 10.5 mg/dL   Total Protein 6.3  6.0 - 8.3 g/dL   Albumin 2.7 (*) 3.5 - 5.2 g/dL   AST 19  0 - 37 U/L   ALT 9  0 - 35 U/L   Alkaline Phosphatase 84  39 - 117 U/L   Total Bilirubin 0.3  0.3 - 1.2 mg/dL   GFR calc non Af Amer >90  >90 mL/min   GFR calc Af Amer >90  >90 mL/min   Comment: (NOTE)     Diana eGFR has been calculated using Diana CKD EPI equation.     This calculation has not been validated in all clinical situations.     eGFR's persistently <90 mL/min signify possible Chronic Kidney     Disease.   Anion gap 9  5 - 15  URIC ACID     Status: None   Collection Time    04/09/14  4:45 PM      Result Value Ref Range   Uric Acid, Serum 6.4  2.4 - 7.0 mg/dL  LACTATE DEHYDROGENASE     Status: None   Collection Time    04/09/14  4:45 PM      Result Value Ref Range   LDH 200  94 - 250 U/L  URINALYSIS, ROUTINE W REFLEX MICROSCOPIC     Status: Abnormal   Collection Time    04/26/14 12:05 PM      Result Value Ref Range   Color, Urine YELLOW  YELLOW   APPearance CLEAR  CLEAR   Specific Gravity, Urine 1.020  1.005 - 1.030   pH 7.0  5.0 - 8.0   Glucose, UA NEGATIVE  NEGATIVE mg/dL   Hgb urine dipstick NEGATIVE  NEGATIVE   Bilirubin Urine NEGATIVE  NEGATIVE   Ketones, ur NEGATIVE  NEGATIVE mg/dL   Protein, ur >300 (*) NEGATIVE mg/dL   Urobilinogen, UA 0.2  0.0 - 1.0 mg/dL   Nitrite NEGATIVE  NEGATIVE   Leukocytes, UA NEGATIVE  NEGATIVE  PROTEIN / CREATININE RATIO, URINE     Status: Abnormal   Collection Time    04/26/14 12:05 PM      Result Value Ref Range   Creatinine, Urine 175.10     Total Protein, Urine 924.8     Comment: NO NORMAL RANGE ESTABLISHED FOR THIS TEST     RESULTS CONFIRMED BY MANUAL DILUTION   PROTEIN CREATININE RATIO 5.28 (*) 0.00 - 0.15  URINE MICROSCOPIC-ADD ON     Status: Abnormal   Collection  Time    04/26/14 12:05 PM      Result Value Ref Range   Squamous Epithelial / LPF FEW (*) RARE   WBC, UA 3-6  <3 WBC/hpf   RBC / HPF 0-2  <3 RBC/hpf   Bacteria, UA MANY (*) RARE   Casts HYALINE CASTS (*) NEGATIVE   Urine-Other MUCOUS PRESENT    CBC     Status: Abnormal   Collection Time    04/26/14 12:28 PM      Result Value Ref Range   WBC 7.0  4.0 - 10.5 K/uL   RBC 3.81 (*) 3.87 - 5.11 MIL/uL   Hemoglobin 11.3 (*) 12.0 - 15.0 g/dL   HCT 33.3 (*) 36.0 - 46.0 %   MCV 87.4  78.0 - 100.0 fL  MCH 29.7  26.0 - 34.0 pg   MCHC 33.9  30.0 - 36.0 g/dL   RDW 14.7  11.5 - 15.5 %   Platelets 129 (*) 150 - 400 K/uL  COMPREHENSIVE METABOLIC PANEL     Status: Abnormal   Collection Time    04/26/14 12:28 PM      Result Value Ref Range   Sodium 135 (*) 137 - 147 mEq/L   Potassium 4.5  3.7 - 5.3 mEq/L   Chloride 104  96 - 112 mEq/L   CO2 19  19 - 32 mEq/L   Glucose, Bld 100 (*) 70 - 99 mg/dL   BUN 14  6 - 23 mg/dL   Creatinine, Ser 0.74  0.50 - 1.10 mg/dL   Calcium 8.2 (*) 8.4 - 10.5 mg/dL   Total Protein 5.5 (*) 6.0 - 8.3 g/dL   Albumin 2.0 (*) 3.5 - 5.2 g/dL   AST 21  0 - 37 U/L   ALT 16  0 - 35 U/L   Alkaline Phosphatase 88  39 - 117 U/L   Total Bilirubin 0.3  0.3 - 1.2 mg/dL   GFR calc non Af Amer >90  >90 mL/min   GFR calc Af Amer >90  >90 mL/min   Comment: (NOTE)     Diana eGFR has been calculated using Diana CKD EPI equation.     This calculation has not been validated in all clinical situations.     eGFR's persistently <90 mL/min signify possible Chronic Kidney     Disease.   Anion gap 12  5 - 15  URIC ACID     Status: Abnormal   Collection Time    04/26/14 12:28 PM      Result Value Ref Range   Uric Acid, Serum 7.7 (*) 2.4 - 7.0 mg/dL  LACTATE DEHYDROGENASE     Status: None   Collection Time    04/26/14 12:28 PM      Result Value Ref Range   LDH 192  94 - 250 U/L  OB RESULTS CONSOLE GC/CHLAMYDIA     Status: None   Collection Time    04/26/14  4:06 PM      Result  Value Ref Range   Chlamydia Negative    OB RESULTS CONSOLE GC/CHLAMYDIA     Status: None   Collection Time    04/26/14  4:07 PM      Result Value Ref Range   Gonorrhea Negative    OB RESULTS CONSOLE RPR     Status: None   Collection Time    04/26/14  4:07 PM      Result Value Ref Range   RPR Nonreactive    OB RESULTS CONSOLE HIV ANTIBODY (ROUTINE TESTING)     Status: None   Collection Time    04/26/14  4:07 PM      Result Value Ref Range   HIV Non-reactive    OB RESULTS CONSOLE RUBELLA ANTIBODY, IGM     Status: None   Collection Time    04/26/14  4:07 PM      Result Value Ref Range   Rubella Immune    OB RESULTS CONSOLE HEPATITIS B SURFACE ANTIGEN     Status: None   Collection Time    04/26/14  4:07 PM      Result Value Ref Range   Hepatitis B Surface Ag Negative    OB RESULTS CONSOLE ABO/RH     Status: None   Collection Time  04/26/14  4:07 PM      Result Value Ref Range   RH Type  Negative     ABO Grouping A    OB RESULTS CONSOLE ANTIBODY SCREEN     Status: None   Collection Time    04/26/14  4:07 PM      Result Value Ref Range   Antibody Screen Negative    TYPE AND SCREEN     Status: None   Collection Time    04/26/14  4:10 PM      Result Value Ref Range   ABO/RH(D) A NEG     Antibody Screen POS     Sample Expiration 04/29/2014     Antibody Identification PASSIVELY ACQUIRED ANTI-D     DAT, 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     Unit Number [id number]     Blood Component Type RED CELLS,LR     Unit division 00     Status of Unit REL FROM [facility]     Transfusion Status OK TO TRANSFUSE     Crossmatch Result COMPATIBLE     Unit Number [id number]     Blood Component Type RBC LR PHER1     Unit division 00     Status of Unit ALLOCATED     Transfusion Status OK TO TRANSFUSE     Crossmatch Result COMPATIBLE    CBC     Status: Abnormal   Collection Time    04/27/14  5:18 AM      Result Value Ref Range   WBC 8.7  4.0 - 10.5 K/uL   RBC 3.70 (*) 3.87 - 5.11 MIL/uL   Hemoglobin 11.0  (*) 12.0 - 15.0 g/dL   HCT 32.3 (*) 36.0 - 46.0 %   MCV 87.3  78.0 - 100.0 fL   MCH 29.7  26.0 - 34.0 pg   MCHC 34.1  30.0 - 36.0 g/dL   RDW 14.8  11.5 - 15.5 %   Platelets 116 (*) 150 - 400 K/uL   Comment: SPECIMEN CHECKED FOR CLOTS     REPEATED TO VERIFY     PLATELET COUNT CONFIRMED BY SMEAR  COMPREHENSIVE METABOLIC PANEL     Status: Abnormal   Collection Time    04/27/14  5:18 AM      Result Value Ref Range   Sodium 140  137 - 147 mEq/L   Potassium 4.7  3.7 - 5.3 mEq/L   Chloride 108  96 - 112 mEq/L   CO2 22  19 - 32 mEq/L   Glucose, Bld 85  70 - 99 mg/dL   BUN 16  6 - 23 mg/dL   Creatinine, Ser 0.78  0.50 - 1.10 mg/dL   Calcium 8.3 (*) 8.4 - 10.5 mg/dL   Total Protein 4.9 (*) 6.0 - 8.3 g/dL   Albumin 1.9 (*) 3.5 - 5.2 g/dL   AST 20  0 - 37 U/L   ALT 17  0 - 35 U/L   Alkaline Phosphatase 92  39 - 117 U/L   Total Bilirubin <0.2 (*) 0.3 - 1.2 mg/dL   GFR calc non Af Amer >90  >90 mL/min   GFR calc Af Amer >90  >90 mL/min   Comment: (NOTE)     Diana eGFR has been calculated using Diana CKD EPI equation.     This calculation has not been validated in all clinical situations.     eGFR's persistently <90 mL/min signify possible Chronic Kidney     Disease.   Anion gap 10  5 - 15  CBC     Status: Abnormal   Collection Time    04/27/14 11:48 AM      Result Value Ref Range   WBC 8.7  4.0 - 10.5 K/uL   RBC 3.84 (*) 3.87 - 5.11 MIL/uL   Hemoglobin 11.5 (*) 12.0 - 15.0 g/dL   HCT 33.6 (*) 36.0 - 46.0 %   MCV 87.5  78.0 - 100.0 fL   MCH 29.9  26.0 - 34.0 pg   MCHC 34.2  30.0 - 36.0 g/dL   RDW 14.8  11.5 - 15.5 %   Platelets 130 (*) 150 - 400 K/uL  PROTEIN, URINE, 24 HOUR     Status: Abnormal   Collection Time    04/27/14  4:00 PM      Result Value Ref Range   Urine Total Volume-UPROT 1425     Collection Interval-UPROT 24     Protein, Urine 360 (*) 5 - 24 mg/dL   Comment: (NOTE)     Result confirmed by automatic dilution.     ** Please note change in reference range(s). **    Protein, 24H Urine 5130 (*) <150 mg/day   Comment: ** Please note change in reference range(s). **     Performed at [facility]  CBC     Status: Abnormal   Collection Time    04/28/14  5:45 AM      Result Value Ref Range   WBC 7.7  4.0 - 10.5 K/uL   RBC 3.58 (*) 3.87 - 5.11 MIL/uL   Hemoglobin 10.7 (*) 12.0 - 15.0 g/dL   HCT 31.4 (*) 36.0 - 46.0 %   MCV 87.7  78.0 - 100.0 fL   MCH 29.9  26.0 - 34.0 pg   MCHC 34.1  30.0 - 36.0 g/dL   RDW 14.9  11.5 - 15.5 %   Platelets 126 (*) 150 - 400 K/uL    A/P: 1) Single IUP at 31w 0d         2) Preeclampsia - by proteinuria and blood pressure criteria.  Platelet count improved - no evidence of HELLP at this time.  BPs labile, but mostly well controlled on current dose of Labetalol.  Recommendations: 1) Would discontinue Magnesium sulfate at this time. 2) Continue close inpatient observation.  At least initially, would check preeclampsia labs daily or twice daily.  May decrease frequency (every other day) if lab values remain stable over Diana next 48-72 hours. 3) Would continue Labetalol at current dose.  She appears to be well controlled.  If Diana Simpson develops severe range blood pressures at this current dose, would move toward delivery rather than increasing her blood pressure medication doses or adding a second agent. 4) Would move toward delivery for: 1) platelets < 100k or LFTs > 2x normal range 2) Creat > 1.5  3) Severe range blood pressures (SBD> 160 or DPB > 110).  4) Severe headaches not responsive to medications.  If otherwise stable, would recommend delivery at 34 weeks.  Recommendations discussed with Dr. Gaetano Net.   Thank you for Diana opportunity to be a part of Diana care of Diana Simpson. Please contact our office if we can be of further assistance.   I spent approximately 30 minutes with this Simpson with over 50% of time spent in face-to-face counseling.  Benjaman Lobe, MD Maternal Fetal Medicine

## 2014-04-28 NOTE — Anesthesia Preprocedure Evaluation (Addendum)
Anesthesia Evaluation  Patient identified by MRN, date of birth, ID band Patient awake    Reviewed: Allergy & Precautions, H&P , Patient's Chart, lab work & pertinent test results  Airway Mallampati: II TM Distance: >3 FB Neck ROM: full    Dental no notable dental hx.    Pulmonary  breath sounds clear to auscultation  Pulmonary exam normal       Cardiovascular Exercise Tolerance: Good Rhythm:regular Rate:Normal     Neuro/Psych    GI/Hepatic   Endo/Other    Renal/GU      Musculoskeletal   Abdominal   Peds  Hematology   Anesthesia Other Findings   Reproductive/Obstetrics                           Anesthesia Physical Anesthesia Plan  ASA: III and emergent  Anesthesia Plan: Spinal, Combined Spinal and Epidural and Epidural   Post-op Pain Management:    Induction:   Airway Management Planned:   Additional Equipment:   Intra-op Plan:   Post-operative Plan:   Informed Consent: I have reviewed the patients History and Physical, chart, labs and discussed the procedure including the risks, benefits and alternatives for the proposed anesthesia with the patient or authorized representative who has indicated his/her understanding and acceptance.   Dental Advisory Given  Plan Discussed with: CRNA  Anesthesia Plan Comments: (Lab work confirmed with CRNA in room. Platelets okay. Discussed spinal anesthetic, and patient consents to the procedure:  included risk of possible headache,backache, failed block, allergic reaction, and nerve injury. This patient was asked if she had any questions or concerns before the procedure started. )        Anesthesia Quick Evaluation

## 2014-04-28 NOTE — Brief Op Note (Signed)
04/26/2014 - 04/28/2014  9:09 PM  PATIENT:  Diana Simpson  28 y.o. female  PRE-OPERATIVE DIAGNOSIS:  Severe Preeclampsia  POST-OPERATIVE DIAGNOSIS:  Severe Preeclampsia  PROCEDURE:  Procedure(s): CESAREAN SECTION (N/A)  SURGEON:  Surgeon(s) and Role:    * Allena Katz, MD - Primary  PHYSICIAN ASSISTANT:   ASSISTANTS: none   ANESTHESIA:   epidural and spinal  EBL:  Total I/O In: 500 [I.V.:500] Out: 950 [Urine:100; Blood:850]  BLOOD ADMINISTERED:none  DRAINS: Urinary Catheter (Foley)   LOCAL MEDICATIONS USED:  Amount: 50ml in 75ml saline , NONE and OTHER Exparel  SPECIMEN:  Source of Specimen:  placenta  DISPOSITION OF SPECIMEN:  PATHOLOGY  COUNTS:  YES  TOURNIQUET:  * No tourniquets in log *  DICTATION: .Other Dictation: Dictation Number [id number]  PLAN OF CARE: Admit to inpatient   PATIENT DISPOSITION:  PACU - hemodynamically stable.   Delay start of Pharmacological VTE agent (>24hrs) due to surgical blood loss or risk of bleeding: not applicable

## 2014-04-28 NOTE — Progress Notes (Signed)
31 0/7 min  No HA, no vision change, no epigastric pain  Filed Vitals:   04/28/14 1338  BP: 124/68  Pulse: 68  Temp: 98.6 F (37 C)  Resp: 18   DTR 1+ (magnesium on)  Vertex on U/S  Results for orders placed during the hospital encounter of 04/26/14 (from the past 24 hour(s))  PROTEIN, URINE, 24 HOUR     Status: Abnormal   Collection Time    04/27/14  4:00 PM      Result Value Ref Range   Urine Total Volume-UPROT 1425     Collection Interval-UPROT 24     Protein, Urine 360 (*) 5 - 24 mg/dL   Protein, 24H Urine 5130 (*) <150 mg/day  CBC     Status: Abnormal   Collection Time    04/28/14  5:45 AM      Result Value Ref Range   WBC 7.7  4.0 - 10.5 K/uL   RBC 3.58 (*) 3.87 - 5.11 MIL/uL   Hemoglobin 10.7 (*) 12.0 - 15.0 g/dL   HCT 31.4 (*) 36.0 - 46.0 %   MCV 87.7  78.0 - 100.0 fL   MCH 29.9  26.0 - 34.0 pg   MCHC 34.1  30.0 - 36.0 g/dL   RDW 14.9  11.5 - 15.5 %   Platelets 126 (*) 150 - 400 K/uL    FHT reactive  A/P: 31 weeks with preeclampsia-see MFM consultation note         Will stop magnesium sulfate         Labs @ 1900         D/W patient plan-labs twice/day, continue same dose of labetalol         Move to delivery if BPs elevate, labs abnormal or abnormal fetal testing

## 2014-04-28 NOTE — Transfer of Care (Signed)
Immediate Anesthesia Transfer of Care Note  Patient: Diana Simpson  Procedure(s) Performed: Procedure(s): CESAREAN SECTION (N/A)  Patient Location: PACU  Anesthesia Type:Spinal  Level of Consciousness: awake, alert  and oriented  Airway & Oxygen Therapy: Patient Spontanous Breathing  Post-op Assessment: Report given to PACU RN and Post -op Vital signs reviewed and stable  Post vital signs: Reviewed and stable  Complications: No apparent anesthesia complications

## 2014-04-29 ENCOUNTER — Encounter (HOSPITAL_COMMUNITY): Payer: Self-pay | Admitting: *Deleted

## 2014-04-29 LAB — COMPREHENSIVE METABOLIC PANEL
ALK PHOS: 72 U/L (ref 39–117)
ALT: 33 U/L (ref 0–35)
ALT: 28 U/L (ref 0–35)
AST: 51 U/L — AB (ref 0–37)
AST: 41 U/L — ABNORMAL HIGH (ref 0–37)
Albumin: 1.6 g/dL — ABNORMAL LOW (ref 3.5–5.2)
Albumin: 1.6 g/dL — ABNORMAL LOW (ref 3.5–5.2)
Alkaline Phosphatase: 71 U/L (ref 39–117)
Anion gap: 9 (ref 5–15)
Anion gap: 9 (ref 5–15)
BILIRUBIN TOTAL: 0.3 mg/dL (ref 0.3–1.2)
BUN: 12 mg/dL (ref 6–23)
BUN: 11 mg/dL (ref 6–23)
CHLORIDE: 100 meq/L (ref 96–112)
CO2: 23 mEq/L (ref 19–32)
CO2: 25 mEq/L (ref 19–32)
CREATININE: 0.8 mg/dL (ref 0.50–1.10)
Calcium: 7 mg/dL — ABNORMAL LOW (ref 8.4–10.5)
Calcium: 7.1 mg/dL — ABNORMAL LOW (ref 8.4–10.5)
Chloride: 102 mEq/L (ref 96–112)
Creatinine, Ser: 0.73 mg/dL (ref 0.50–1.10)
GFR calc Af Amer: >90 mL/min (ref >90)
GFR calc non Af Amer: >90 mL/min (ref >90)
GFR calc non Af Amer: >90 mL/min (ref >90)
GLUCOSE: 70 mg/dL (ref 70–99)
Glucose, Bld: 112 mg/dL — ABNORMAL HIGH (ref 70–99)
POTASSIUM: 4.8 meq/L (ref 3.7–5.3)
Potassium: 4.7 mEq/L (ref 3.7–5.3)
Sodium: 134 mEq/L — ABNORMAL LOW (ref 137–147)
Sodium: 134 mEq/L — ABNORMAL LOW (ref 137–147)
TOTAL PROTEIN: 4.3 g/dL — AB (ref 6.0–8.3)
Total Bilirubin: 0.4 mg/dL (ref 0.3–1.2)
Total Protein: 4.5 g/dL — ABNORMAL LOW (ref 6.0–8.3)

## 2014-04-29 LAB — CBC
HCT: 26.7 % — ABNORMAL LOW (ref 36.0–46.0)
HEMATOCRIT: 28.4 % — AB (ref 36.0–46.0)
HEMOGLOBIN: 9.6 g/dL — AB (ref 12.0–15.0)
HEMOGLOBIN: 9.1 g/dL — AB (ref 12.0–15.0)
MCH: 29.4 pg (ref 26.0–34.0)
MCH: 30 pg (ref 26.0–34.0)
MCHC: 33.8 g/dL (ref 30.0–36.0)
MCHC: 34.1 g/dL (ref 30.0–36.0)
MCV: 87.1 fL (ref 78.0–100.0)
MCV: 88.1 fL (ref 78.0–100.0)
PLATELETS: 99 10*3/uL — AB (ref 150–400)
Platelets: 103 10*3/uL — ABNORMAL LOW (ref 150–400)
RBC: 3.26 MIL/uL — ABNORMAL LOW (ref 3.87–5.11)
RBC: 3.03 MIL/uL — AB (ref 3.87–5.11)
RDW: 14.8 % (ref 11.5–15.5)
RDW: 15 % (ref 11.5–15.5)
WBC: 8.2 10*3/uL (ref 4.0–10.5)
WBC: 7 10*3/uL (ref 4.0–10.5)

## 2014-04-29 LAB — URIC ACID: Uric Acid, Serum: 6.9 mg/dL (ref 2.4–7.0)

## 2014-04-29 MED ORDER — PANTOPRAZOLE SODIUM 40 MG PO TBEC
40.0000 mg | DELAYED_RELEASE_TABLET | Freq: Every day | ORAL | Status: DC
  Administered 2014-04-29 – 2014-05-02 (×4): 40 mg via ORAL
  Filled 2014-04-29 (×4): qty 1

## 2014-04-29 NOTE — Addendum Note (Signed)
Addendum created 04/29/14 9093 by Jonna Munro, CRNA   Modules edited: Notes Section   Notes Section:  File: [id number]

## 2014-04-29 NOTE — Progress Notes (Signed)
Subjective: Postpartum Day 1: Cesarean Delivery Patient reports incisional pain and tolerating PO.    Objective: Vital signs in last 24 hours: Temp:  [97.7 F (36.5 C)-98.6 F (37 C)] 97.8 F (36.6 C) (09/09 0756) Pulse Rate:  [49-68] 49 (09/09 0800) Resp:  [8-26] 16 (09/09 0800) BP: (124-172)/(68-103) 133/91 mmHg (09/09 0800) SpO2:  [94 %-100 %] 97 % (09/09 0800) Weight:  [91.944 kg (202 lb 11.2 oz)-92.126 kg (203 lb 1.6 oz)] 91.944 kg (202 lb 11.2 oz) (09/09 0600)  Physical Exam:  General: alert, cooperative and appears stated age 28: appropriate Uterine Fundus: firm Incision: healing well, no significant drainage, no dehiscence DVT Evaluation: No evidence of DVT seen on physical exam.   Recent Labs  04/28/14 1710 04/28/14 2203  HGB 10.9* 10.4*  HCT 31.4* 30.1*    Assessment/Plan: Status post Cesarean section. Doing well postoperatively. Severe Preeclampsia - Baby in NICU on CPAP Continue Magnesium recheck labs at 2 pm today and in the am. I would recommend Continuing the Magnesium until tomorrow am Continue Labetalol  Plan of care reviewed with patient and her husband  Continue current care.  Diana Simpson L 04/29/2014, 8:49 AM

## 2014-04-29 NOTE — Anesthesia Postprocedure Evaluation (Signed)
  Anesthesia Post-op Note  Patient: Diana Simpson  Procedure(s) Performed: Procedure(s): CESAREAN SECTION (N/A)  Patient Location: ICU  Anesthesia Type:Spinal  Level of Consciousness: awake, alert  and oriented  Airway and Oxygen Therapy: Patient Spontanous Breathing  Post-op Pain: none  Post-op Assessment: Post-op Vital signs reviewed, Patient's Cardiovascular Status Stable, Respiratory Function Stable, No headache, No backache, No residual numbness and No residual motor weakness  Post-op Vital Signs: Reviewed and stable  Last Vitals:  Filed Vitals:   04/29/14 0800  BP: 133/91  Pulse: 49  Temp:   Resp: 16    Complications: No apparent anesthesia complications

## 2014-04-29 NOTE — Op Note (Signed)
[name], [name] NO.:  [id number]  MEDICAL RECORD NO.:  [id number]  LOCATION:  7564                          FACILITY:  [facility]  PHYSICIAN:  Daleen Bo. Gaetano Net, M.D. DATE OF BIRTH:  1986-03-27  DATE OF PROCEDURE:  04/28/2014 DATE OF DISCHARGE:                              OPERATIVE REPORT   PREOPERATIVE DIAGNOSIS: 1. Intrauterine pregnancy at 56 and 0/7th weeks. 2. Severe preeclampsia.  POSTOPERATIVE DIAGNOSIS: 1. Intrauterine pregnancy at 65 and 0/7th weeks. 2. Severe preeclampsia.  PROCEDURE:  Low-transverse cesarean section.  SURGEON:  Daleen Bo. Gaetano Net, M.D.  ANESTHESIA:  Combined spinal, epidural by Dr. Lyndle Herrlich.  ESTIMATED BLOOD LOSS:  500 mL.  SPECIMENS:  Placenta to Pathology.  FINDINGS:  Viable female infant.  Apgars, weight, and arterial cord pH.  INDICATIONS AND CONSENT:  This patient is a 28 year old G1, P0 at 9 and 0/7th weeks.  She is monitored in the antenatal unit.  Consultation with maternal fetal medicine has been undertaken.  The patient had been on magnesium sulfate earlier in the day in anticipation of possible delivery.  At that time, blood pressures had stabilized on a dose of labetalol.  After consultation was obtained.  Magnesium was discontinued.  This evening the patient developed headache and blood pressure is elevated.  Per the plan outlined on the chart delivery was recommended.  Cesarean section was discussed with the patient. Potential risks and complications were discussed with the patient preoperatively including, but not limited to, infection, organ damage, bleeding, requiring transfusion of blood products with HIV and hepatitis acquisition, DVT, PE, pneumonia.  All questions were answered and consent is signed on the chart.  PROCEDURE IN DETAIL:  The patient was taken to the operating room, where she was identified and her combined spinal epidural was placed per Dr. Lyndle Herrlich.  She was then placed in a dorsal  supine position with a 15- degree left lateral wedge.  Time-out was undertaken.  Foley catheter was placed and she was prepped and draped for [facility] protocol. After testing for adequate spinal anesthesia, skin was entered through a Pfannenstiel incision and dissection was carried out in layers of the peritoneum, which was incised and extended superiorly and inferiorly. Vesicouterine peritoneum was taken down cephalad laterally.  The bladder flap was developed.  The bladder blade was placed.  Uterus was incised in a low transverse manner.  The uterine cavity was entered bluntly with a hemostat.  The uterine incision was extended cephalad laterally with fingers.  Artificial rupture of membranes for clear fluid was carried out.  Baby was then delivered from the vertex position without difficulty.  Cord was clamped and cut.  The baby was handed to awaiting pediatrics team.  Placenta was manually delivered and sent to Pathology. Uterine cavity was clean.  Uterus was closed in 2 running locking imbricating layers of 0 Monocryl suture, which achieved good hemostasis. Irrigation was carried out.  The anterior peritoneum was closed in a running fashion with 0 Monocryl suture, which was also used to reapproximate the pyramidalis muscle in the midline.  The anterior rectus fascia was closed in a running fashion with a 0 looped PDS suture.  After assuring good hemostasis, 20 mL of Exparel dissolved in 20 mL of normal saline were then injected both subfascially and subcutaneously.  Subcutaneous tissue was then reapproximated with interrupted plain suture and the skin was closed in a subcuticular fashion with Vicryl on a Keith needle.  Steri-Strips were applied. Dressings were applied.  All counts were correct.  The patient was taken to recovery room in stable condition.  She will be started on magnesium sulfate for seizure prophylaxis postpartum.     Daleen Bo Gaetano Net,  M.D.     JET/MEDQ  D:  04/28/2014  T:  04/29/2014  Job:  [id number]

## 2014-04-29 NOTE — Progress Notes (Signed)
Instructed on use of breast pump, set up and pumping initiated.  Given booklet and benefits of providing breasstmilk to baby in NICU discussed.

## 2014-04-30 LAB — COMPREHENSIVE METABOLIC PANEL
ALK PHOS: 76 U/L (ref 39–117)
ALT: 24 U/L (ref 0–35)
AST: 29 U/L (ref 0–37)
Albumin: 1.7 g/dL — ABNORMAL LOW (ref 3.5–5.2)
Anion gap: 9 (ref 5–15)
BUN: 8 mg/dL (ref 6–23)
CHLORIDE: 104 meq/L (ref 96–112)
CO2: 26 mEq/L (ref 19–32)
Calcium: 6.9 mg/dL — ABNORMAL LOW (ref 8.4–10.5)
Creatinine, Ser: 0.71 mg/dL (ref 0.50–1.10)
GFR calc Af Amer: >90 mL/min (ref >90)
GFR calc non Af Amer: >90 mL/min (ref >90)
Glucose, Bld: 72 mg/dL (ref 70–99)
POTASSIUM: 4.5 meq/L (ref 3.7–5.3)
Sodium: 139 mEq/L (ref 137–147)
Total Protein: 4.6 g/dL — ABNORMAL LOW (ref 6.0–8.3)

## 2014-04-30 LAB — CBC
HEMATOCRIT: 29.7 % — AB (ref 36.0–46.0)
Hemoglobin: 10.1 g/dL — ABNORMAL LOW (ref 12.0–15.0)
MCH: 29.9 pg (ref 26.0–34.0)
MCHC: 34 g/dL (ref 30.0–36.0)
MCV: 87.9 fL (ref 78.0–100.0)
PLATELETS: 111 10*3/uL — AB (ref 150–400)
RBC: 3.38 MIL/uL — AB (ref 3.87–5.11)
RDW: 15.3 % (ref 11.5–15.5)
WBC: 7.6 10*3/uL (ref 4.0–10.5)

## 2014-04-30 LAB — TYPE AND SCREEN
ABO/RH(D): A NEG
ANTIBODY SCREEN: POSITIVE
DAT, IgG: NEGATIVE
Unit division: 0
Unit division: 0

## 2014-04-30 NOTE — Progress Notes (Signed)
No complaints.  No HA, CP/SOB, vision change, or RUQ pain.  Pain well-controlled.  Tolerating po.  Ambulating well.    VSS (BP nl-mild range) HELLP labs improving (PLT 99->111) UOP >100cc/hr  Gen: A&O x 3 Abd: soft, ND, honeycomb c/d/i Ext: 1+ edema, brisk reflexes, no clonus  28yo G1P0101 POD#2 s/p C/S for Pre-eclampsia with severe features -Postop-meeting goals.   -Pre-E-magnesium sulfate discontinued this AM.  Transfer to floor bed when able. -Repeat labs tomorrow AM -Continue labetalol  Linda Hedges, DO

## 2014-04-30 NOTE — Progress Notes (Signed)
04/30/14 1500  Clinical Encounter Type  Visited With Patient and family together (husband Diana Simpson)  Visit Type Initial  Spiritual Encounters  Spiritual Needs Emotional  Stress Factors  Patient Stress Factors (baby in NICU)  Family Stress Factors (baby in NICU)   Met Diana Simpson and and Diana Simpson briefly on AICU as they were preparing for transfer to [facility].  Introduced Spiritual Care and chaplain availability as part of their support team throughout their NICU experience.  Pt appreciative.  Will follow as we see family in NICU, but please also page as needs arise.  Thank you.  Chaplain Lisa Lundeen, MDiv [phone] 

## 2014-04-30 NOTE — Lactation Note (Signed)
This note was copied from the chart of [place]. Lactation Consultation Note   Follow up consult with this mom of a NICU baby, now 67 hours old, and 31 2/7 week CGA. Mom is still in AICU, but off magnesium drip. She has been pumping in premie setting, and expressing drops of colostrum. Mom demonstrated hand expression with good technique - I think she is beginning to transition into mature milk. I reviewed the [place] on providing EBm for a NICU baby. Mom and dad ver receptive to teaching. Mom has already done skin to skin with her baby. Mom knows to call for questions/concerns. O/P lactation services also reviewed with mom.   Patient Name: Boy Akshaya Toepfer GEXBM'W Date: 04/30/2014 Reason for consult: Follow-up assessment;NICU baby;Infant < 6lbs   Maternal Data    Feeding    LATCH Score/Interventions                      Lactation Tools Discussed/Used Tools: Pump Breast pump type: Double-Electric Breast Pump WIC Program: No Initiated by:: bedside RN Date initiated:: 04/28/14   Consult Status Consult Status: Follow-up Date: 05/01/14 Follow-up type: In-patient    [name] 04/30/2014, 10:31 AM

## 2014-04-30 NOTE — Progress Notes (Signed)
UR chart review completed.  

## 2014-05-01 LAB — COMPREHENSIVE METABOLIC PANEL
ALBUMIN: 1.9 g/dL — AB (ref 3.5–5.2)
ALT: 23 U/L (ref 0–35)
ANION GAP: 12 (ref 5–15)
AST: 27 U/L (ref 0–37)
Alkaline Phosphatase: 74 U/L (ref 39–117)
BILIRUBIN TOTAL: 0.3 mg/dL (ref 0.3–1.2)
BUN: 9 mg/dL (ref 6–23)
CO2: 24 mEq/L (ref 19–32)
CREATININE: 0.74 mg/dL (ref 0.50–1.10)
Calcium: 7.9 mg/dL — ABNORMAL LOW (ref 8.4–10.5)
Chloride: 105 mEq/L (ref 96–112)
GFR calc Af Amer: >90 mL/min (ref >90)
GFR calc non Af Amer: >90 mL/min (ref >90)
Glucose, Bld: 76 mg/dL (ref 70–99)
Potassium: 4.5 mEq/L (ref 3.7–5.3)
Sodium: 141 mEq/L (ref 137–147)
TOTAL PROTEIN: 5 g/dL — AB (ref 6.0–8.3)

## 2014-05-01 LAB — CBC
HCT: 28 % — ABNORMAL LOW (ref 36.0–46.0)
Hemoglobin: 9.5 g/dL — ABNORMAL LOW (ref 12.0–15.0)
MCH: 30.2 pg (ref 26.0–34.0)
MCHC: 33.9 g/dL (ref 30.0–36.0)
MCV: 88.9 fL (ref 78.0–100.0)
Platelets: 134 10*3/uL — ABNORMAL LOW (ref 150–400)
RBC: 3.15 MIL/uL — ABNORMAL LOW (ref 3.87–5.11)
RDW: 15.3 % (ref 11.5–15.5)
WBC: 9.2 10*3/uL (ref 4.0–10.5)

## 2014-05-01 MED ORDER — RHO D IMMUNE GLOBULIN 1500 UNIT/2ML IJ SOSY
300.0000 ug | PREFILLED_SYRINGE | Freq: Once | INTRAMUSCULAR | Status: AC
  Administered 2014-05-01: 300 ug via INTRAMUSCULAR
  Filled 2014-05-01: qty 2

## 2014-05-01 NOTE — Progress Notes (Signed)
CSW met with parents briefly in MOB's 3rd floor room to introduce CSW support services while baby is in the NICU. Unfortunately CSW did not have ample time for a full assessment, but wanted to make contact prior to MOB's discharge (now that she is out of AICU) tomorrow. Parents were extremely pleasant and appeared to be in great spirits. They updated CSW on baby's status. They were enjoyable to talk with and receptive to CSW's intervention and open to follow up next week. CSW discussed PPD signs and symptoms at length and MOB states she has a hx of Anxiety, takes Zoloft, and is concerned about PPD. CSW discussed normal emotions experienced during the post partum period and what to watch for that is concerning to CSW. CSW asked MOB to reach out to CSW and or her OB if she has concerns at any time. MOB states she is open to having CSW check in with her at any time and states she would like to check in with CSW frequently to discuss her feelings. CSW asked her to please contact CSW any time. CSW encouraged her to allow herself to be emotional, especially on her day of discharge, and to keep talking about her feelings. She agreed. CSW is concerned that MOB may actually be preoccupied with looking for symptoms and subtly addressed this, which seemed to make sense to MOB. FOB asked if she is better off since she is already on an SSRI. CSW expressed CSW's opinion that MOB is absolutely in a better position by already being on an SSRI. FOB told MOB that he is proud of her for taking the medication even though sometimes she feels she wishes she didn't have to. They appear extremely bonded and supportive of each other. They were even able to joke at times throughout the conversation. MOB states she also had a therapist up until a few months ago, but that he moved out of town. She admits to feeling a little bitter about this. CSW validated her feelings. CSW reminded MOB that she most likely has coping skills learned through  therapy and that she can utilize though skills in the stress and emotion of this situation, just like others. Parents seemed very appreciative of CSW's visit. CSW provided contact information and plans to follow up next week while parents are visiting to further evaluate/discuss coping, what to expect from a NICU admission, natural supports, etc.   

## 2014-05-01 NOTE — Lactation Note (Signed)
This note was copied from the chart of [place]. Lactation Consultation Note   Follow up consult on this mom of a NICU baby, now 31 3/7 weeks CGA, and 39 hours old. Mom has an abundant supply, expressing up to 45 mls at a time of transitional milk. She is very excited. I rented mom a 2 week DEP. Mom is pumping in standard setting, 15-30 minutes, until she stops dripping. Mom has been doing skin to skin. Mom knows to call for questions/concerns.   Patient Name: Diana Simpson PRXYV'O Date: 05/01/2014 Reason for consult: Follow-up assessment;NICU baby   Maternal Data    Feeding Feeding Type: Breast Milk  LATCH Score/Interventions                      Lactation Tools Discussed/Used Pump Review: Setup, frequency, and cleaning   Consult Status Consult Status: PRN Follow-up type: In-patient (NNICU)    [name] 05/01/2014, 5:30 PM

## 2014-05-01 NOTE — Progress Notes (Signed)
Subjective: Postpartum Day 3: Cesarean Delivery Patient reports tolerating PO, + flatus and no problems voiding.  Baby stable in NICU on C-PAP  Objective: Vital signs in last 24 hours: Temp:  [98.6 F (37 C)-98.8 F (37.1 C)] 98.6 F (37 C) (09/11 0527) Pulse Rate:  [64-84] 68 (09/11 0527) Resp:  [18] 18 (09/11 0527) BP: (125-160)/(64-118) 140/80 mmHg (09/11 0527) SpO2:  [98 %-100 %] 98 % (09/11 0527) Weight:  [197 lb 12 oz (89.699 kg)] 197 lb 12 oz (89.699 kg) (09/11 0527)  Physical Exam:  General: alert and cooperative Lochia: appropriate Uterine Fundus: firm Incision: honeycomb dressing CDI DVT Evaluation: No evidence of DVT seen on physical exam. Negative Homan's sign. No cords or calf tenderness. No significant calf/ankle edema. Calf/Ankle edema is present. DTR's 2+   Recent Labs  04/30/14 0528 05/01/14 0533  HGB 10.1* 9.5*  HCT 29.7* 28.0*    Assessment/Plan: Status post Cesarean section. Postoperative course complicated by [facility]  Continue current care.  CURTIS,CAROL G 05/01/2014, 8:35 AM

## 2014-05-02 LAB — RH IG WORKUP (INCLUDES ABO/RH)
ABO/RH(D): A NEG
ANTIBODY SCREEN: POSITIVE
DAT, IgG: NEGATIVE
FETAL SCREEN: NEGATIVE
Gestational Age(Wks): 31
Unit division: 0

## 2014-05-02 MED ORDER — IBUPROFEN 600 MG PO TABS: 600.0000 mg | ORAL_TABLET | Freq: Four times a day (QID) | ORAL | Status: DC

## 2014-05-02 MED ORDER — TETANUS-DIPHTH-ACELL PERTUSSIS 5-2.5-18.5 LF-MCG/0.5 IM SUSP
0.5000 mL | Freq: Once | INTRAMUSCULAR | Status: AC
  Administered 2014-05-02: 0.5 mL via INTRAMUSCULAR

## 2014-05-02 MED ORDER — SERTRALINE HCL 100 MG PO TABS: 100.0000 mg | ORAL_TABLET | Freq: Every day | ORAL | Status: AC

## 2014-05-02 MED ORDER — LABETALOL HCL 200 MG PO TABS: 200.0000 mg | ORAL_TABLET | Freq: Three times a day (TID) | ORAL | Status: DC

## 2014-05-02 MED ORDER — INFLUENZA VAC SPLIT QUAD 0.5 ML IM SUSY
0.5000 mL | PREFILLED_SYRINGE | INTRAMUSCULAR | Status: AC
  Administered 2014-05-02: 0.5 mL via INTRAMUSCULAR

## 2014-05-02 MED ORDER — OXYCODONE-ACETAMINOPHEN 5-325 MG PO TABS: 1.0000 | ORAL_TABLET | ORAL | Status: DC | PRN

## 2014-05-02 NOTE — Discharge Summary (Signed)
Obstetric Discharge Summary Reason for Admission: severe preeclampsia Prenatal Procedures: NST and ultrasound Intrapartum Procedures: cesarean: low cervical, transverse Postpartum Procedures: none Complications-Operative and Postpartum: none Hemoglobin  Date Value Ref Range Status  05/01/2014 9.5* 12.0 - 15.0 g/dL Final     HCT  Date Value Ref Range Status  05/01/2014 28.0* 36.0 - 46.0 % Final    Physical Exam:  General: alert, cooperative, appears stated age and no distress Lochia: appropriate Uterine Fundus: firm Incision: healing well DVT Evaluation: No evidence of DVT seen on physical exam.  Discharge Diagnoses: Preelampsia and cesarean section  Discharge Information: Date: 05/02/2014 Activity: pelvic rest Diet: routine Medications: Ibuprofen and Percocet, labetalol Condition: stable Instructions: refer to practice specific booklet Discharge to: home   Newborn Data: Live born female  Birth Weight: 3 lb 0.7 oz (1380 g) APGAR: 5, 7  Home with NICU.  Dash Cardarelli C 05/02/2014, 8:54 AM

## 2014-05-02 NOTE — Progress Notes (Signed)
Discharge instructions reviewed with patient and significant other.  Both state understanding of home meds., activity, signs/symptoms to report to MD and return MD office visit.  Patients significant other will assist with her care @ home and no home equipment needed.  Patient ambulated for discharge in stable condition with staff without incident.

## 2014-05-05 ENCOUNTER — Ambulatory Visit: Payer: Self-pay

## 2014-05-05 NOTE — Lactation Note (Signed)
This note was copied from the chart of [name]. Lactation Consultation Note Follow up consult with this mom of a NICU baby, now 71 6/7 weeks CGA and 41 days old. I spoke to mom at the baby's bedside. She has not been pumping consistently, and going over 6 hours at night without pumping. I reviewed supply and demand, and the importance of pumping at least 8 times a day.  Patient Name: Diana Simpson MOLMB'E Date: 05/05/2014     Maternal Data    Feeding    LATCH Score/Interventions                      Lactation Tools Discussed/Used     Consult Status      [name] 05/05/2014, 4:28 PM

## 2014-05-05 NOTE — Lactation Note (Signed)
This note was copied from the chart of [place]. Lactation Consultation Note    Follow up consult with this mom of a NICU baby, now 49 days old, and 32 week CGA. Mom report she is now pumping every 3 hours around the clock, and expressing 60-9 mls each time. I told mom she could now pump every 4 hours at night twice, and then e ery 2-3 during the day, until she p[ups 8 times a day.   Patient Name: Diana Simpson WERXV'Q Date: 05/05/2014 Reason for consult: Follow-up assessment;NICU baby   Maternal Data    Feeding    LATCH Score/Interventions                      Lactation Tools Discussed/Used     Consult Status Consult Status: PRN Follow-up type: In-patient (NICU)    [name] 05/05/2014, 4:30 PM

## 2014-05-22 ENCOUNTER — Ambulatory Visit: Payer: Self-pay

## 2014-05-22 NOTE — Lactation Note (Signed)
This note was copied from the chart of [place]. Lactation Consultation Note Follow up visit at infant's bedside in NICU.  Mom has an abundant milk supply.  She has nuzzled baby at breasts and attempted latch twice.  Baby held nipple in mouth and suckled a few times.  Baby is receiving all feedings through NG.  Discussed with mom that feeding at the breast will be a slow process but progress will be more consistent as baby matures.  Mom understands and will call for assist early next week.  Discussed nipple shield and possible use to stimulate suck.  Encouraged to call with any concerns prn.  Patient Name: Diana Simpson MEBRA'X Date: 05/22/2014     Maternal Data    Feeding Feeding Type: Breast Milk Nipple Type: Other (dr Roosvelt Harps) Length of feed: 15 min  LATCH Score/Interventions                      Lactation Tools Discussed/Used     Consult Status      Ave Filter 05/22/2014, 1:12 PM

## 2014-05-25 ENCOUNTER — Ambulatory Visit: Payer: Self-pay

## 2014-05-25 NOTE — Lactation Note (Signed)
This note was copied from the chart of [place]. Lactation Consultation Note  Follow up visit made for feeding assist.  Baby is getting NG feeds and very little interest in sucking.  Mom has a good milk supply.  Assisted with positionin baby in cross cradle on left breast.  EBM easily hand expressed prior to latch.  Baby cues and opens mouth easily.  Baby is able to latch and suck 4-5 times before slipping off.  20 mm nipple shield used and baby was able to sustain latch and nursed off and on for 10 minutes.  Baby did well pacing and oxygen sats averaged 80-85 during feeding, respirations 70-90.  Discussed importance of not stressing baby and taking this slow.  Mom very appreciative and understanding.  Patient Name: Diana Simpson BXIDH'W Date: 05/25/2014 Reason for consult: Follow-up assessment;NICU baby;Late preterm infant   Maternal Data    Feeding Feeding Type: Breast Milk Length of feed: 45 min  LATCH Score/Interventions Latch: Grasps breast easily, tongue down, lips flanged, rhythmical sucking. (WITH 20 MM NIPPLE SHIELD)  Audible Swallowing: Spontaneous and intermittent  Type of Nipple: Everted at rest and after stimulation  Comfort (Breast/Nipple): Soft / non-tender     Hold (Positioning): Assistance needed to correctly position infant at breast and maintain latch. Intervention(s): Breastfeeding basics reviewed;Support Pillows;Position options;Skin to skin  LATCH Score: 9  Lactation Tools Discussed/Used     Consult Status Consult Status: Follow-up Date: 05/26/14 Follow-up type: In-patient    Ave Filter 05/25/2014, 3:24 PM

## 2014-06-04 ENCOUNTER — Other Ambulatory Visit: Payer: Self-pay | Admitting: Obstetrics and Gynecology

## 2014-06-05 LAB — CYTOLOGY - PAP

## 2014-06-12 ENCOUNTER — Ambulatory Visit: Payer: Self-pay

## 2014-06-12 NOTE — Lactation Note (Signed)
This note was copied from the chart of Diana Simpson. Lactation Consultation Note    Follow up consult with this mom of a NICU baby, now 23 weeks old, and 37 3/7 weeks CGA. I assisted mom with latching Kyle. A 24 nipple shield was too big, and the 20 nipple shield he accepted well. He would change his breathing pattern while breastfeeding - tachypneac when taking a break(panting), and then WNL when sucking. I fed him about 6 mls of EBM into the shield with a curved tip syringe. He coughed a little with shield full, I gave him a break from feeding, patted his back. He remained pink, calmed , and then fed him again. I filled the shield slowly, and he got into a good suck/swallow /breath pattern. At this time, he was feeding for close to 20 minutes, so we stopped, and let mom bottle feed him. I told mom it will take time to transition him, and she can come back to o/p lactation after Marylyn Ishihara is discharged to home, as needed.   Patient Name: [name] HAFBX'U Date: 06/12/2014 Reason for consult: Follow-up assessment;NICU baby   Maternal Data    Feeding Feeding Type: Breast Fed Length of feed: 30 min  LATCH Score/Interventions Latch: Repeated attempts needed to sustain latch, nipple held in mouth throughout feeding, stimulation needed to elicit sucking reflex. (tried 24 - baby would pull away - probably too big, did well with 20 nipple shield, intermittent sucking, not very deep though) Intervention(s): Adjust position;Assist with latch;Breast massage;Breast compression  Audible Swallowing: None  Type of Nipple: Everted at rest and after stimulation  Comfort (Breast/Nipple): Soft / non-tender     Hold (Positioning): Assistance needed to correctly position infant at breast and maintain latch. Intervention(s): Breastfeeding basics reviewed;Support Pillows;Position options;Skin to skin  LATCH Score: 6  Lactation Tools Discussed/Used     Consult Status Consult Status: PRN Follow-up  type: In-patient (NICU)    [name] 06/12/2014, 6:19 PM

## 2014-06-22 ENCOUNTER — Encounter (HOSPITAL_COMMUNITY): Payer: Self-pay | Admitting: *Deleted

## 2014-07-07 ENCOUNTER — Ambulatory Visit (HOSPITAL_COMMUNITY)
Admission: RE | Admit: 2014-07-07 | Discharge: 2014-07-07 | Disposition: A | Discharge status: Home / Self Care | Payer: Managed Care, Other (non HMO) | Source: Ambulatory Visit | Attending: Obstetrics and Gynecology | Admitting: Obstetrics and Gynecology

## 2014-07-08 ENCOUNTER — Ambulatory Visit (HOSPITAL_COMMUNITY)
Admission: RE | Admit: 2014-07-08 | Discharge: 2014-07-08 | Disposition: A | Discharge status: Home / Self Care | Payer: Managed Care, Other (non HMO) | Source: Ambulatory Visit | Attending: Obstetrics and Gynecology | Admitting: Obstetrics and Gynecology

## 2014-07-08 NOTE — Lactation Note (Addendum)
Lactation ConsultBaby's Name: Diana Simpson Date of Birth: 04/28/2013  Gender: female Gestational Age: [redacted]w[redacted]d (At Birth) Birth Weight: 3 lb 0.7 oz (1380 g) Weight at Discharge: Weight: 6 lb 6.7 oz (2911 g)Date of Discharge: 06/19/2014 Filed Weights   06/16/14 1500 06/17/14 1530 06/18/14 1245  Weight: 6 lb 5.8 oz (2886 g) 6 lb 4.1 oz (2839 g) 6 lb 6.7 oz (2911 g)      Mother's reason for visit:  Follow up visit after NICU Visit Type:  Feeding assessment Appointment Notes:  Baby has been home almost 3 Savian Mazon from NICU Consult:  Initial Lactation Consultant:  Linus Mako D  ________________________________________________________________________    ________________________________________________________________________  Mother's Name: Diana Simpson Type of delivery:   Breastfeeding Experience:  p1    ________________________________________________________________________  Breastfeeding History (Post Discharge)  Frequency of breastfeeding:  q 3 hours Duration of feeding:  40 min  Supplementation  Formula:  Volume 30-40 ml Frequency:  q feeding is mixing half breast milk and half formula due to reflux         Breastmilk:  Volume  30-2ml Frequency: q feeding   Method:  Bottle,   Pumping  Type of pump:  Medela pump in style Frequency: 6 times/day Volume:  6 oz  Infant Intake and Output Assessment  Voids:  QS in 24 hrs.  Color:  Clear yellow Stools:  QS in 24 hrs.  Color:  Yellow  ________________________________________________________________________  Maternal Breast Assessment  Breast:  Soft Nipple:  Erect Pain level:  0 Pain interventions:  NA  _______________________________________________________________________ Feeding Assessment/Evaluation  Initial feeding assessment:  Infant's oral assessment:  WNL  Positioning:  Cross cradle Left breast  LATCH documentation:  Latch:  2 = Grasps breast easily, tongue down, lips flanged,  rhythmical sucking.  Audible swallowing:  0 = None  Type of nipple:  2 = Everted at rest and after stimulation  Comfort (Breast/Nipple):  2 = Soft / non-tender  Hold (Positioning):  2 = No assistance needed to correctly position infant at breast  LATCH score:  8  Attached assessment:  Deep  Lips flanged:  Yes.    Lips untucked:  Yes.    Suck assessment:  Nonnutritive only a couple of swallows noted.did lots of gumming at the breast  Pre-feed weight:  3558 g  (7 lb. 13.5 oz.) Post-feed weight:  3576 g (7 lb. 14.2 oz.) Amount transferred:  18 ml Amount supplemented:  0 ml  Additional Feeding Assessment -   Infant's oral assessment:  WNL  Positioning:  Cross cradle Right breast  LATCH documentation:  Latch:  2 = Grasps breast easily, tongue down, lips flanged, rhythmical sucking.  Audible swallowing:  0 = None  Type of nipple:  2 = Everted at rest and after stimulation  Comfort (Breast/Nipple):  2 = Soft / non-tender  Hold (Positioning):  2 = No assistance needed to correctly position infant at breast  LATCH score:  8  Attached assessment:  Deep  Lips flanged:  Yes.    Lips untucked:  No.  Suck assessment:  Nonnutritive- only a couple of swallows noted   Pre-feed weight:  3576 g  (7 lb. 14.2 oz.) Post-feed weight:  3582 g (7 lb. 14.3 oz.) Amount transferred:  6 ml Amount supplemented:  45 ml     Total amount transferred:  24 ml Total supplement given:  45 ml  Kyle took a few attempts to latch then mostly non nutritive at the breast. Few swallows heard. Mom reports it feels  like he is mostly gumming. Baby bottle feeds well. Has a good suck on my gloved finger. Baby does some panting while at the breast but color stays pink. Praise given to mom- her positioning looks great. Going back to work next week. Discussed pumping and back to work. Mom reports they have plenty of breast milk in the freezer. Asking about bottle feeding- asking which type might be best. Using Dr.  Roosvelt Harps.  No further questions at present. To call prn. Suggested Monday night BFSG to do pre and post weights. To call if wants another OP appointment.

## 2016-06-04 IMAGING — US US FETAL BPP W/O NONSTRESS
1 series · 13 of 15 positions shown · non-contrast
Comparison: none

[Series 1: us fetal bpp w/o nonstress · non-contrast · 15 acquisitions, 13 frames shown]
[im 1/15]
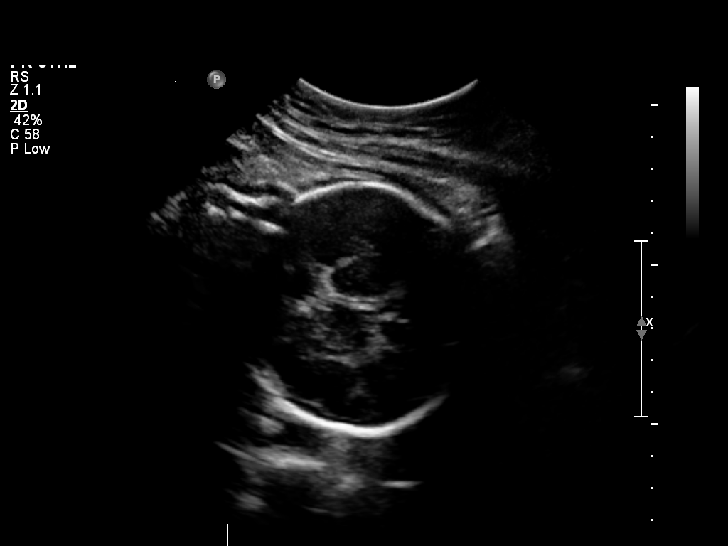
[im 2/15]
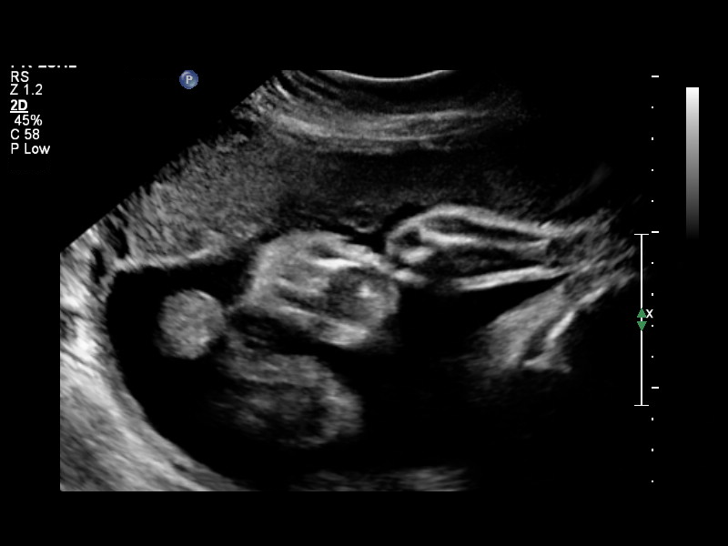
[im 3/15]
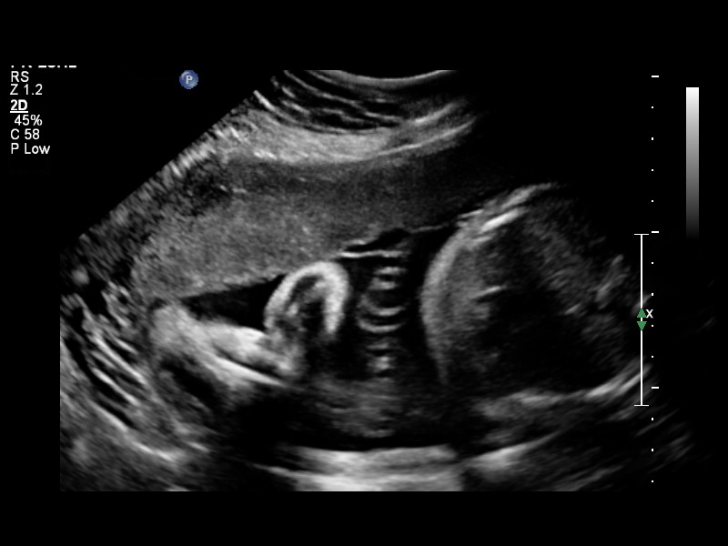
[im 5/15]
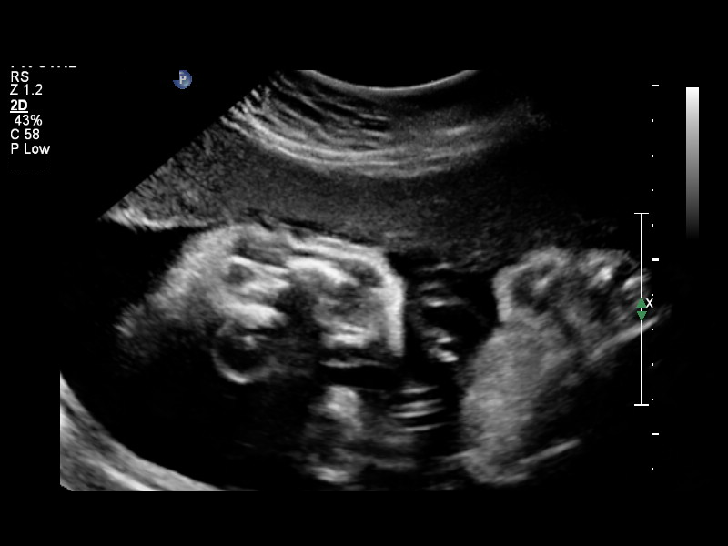
[im 6/15]
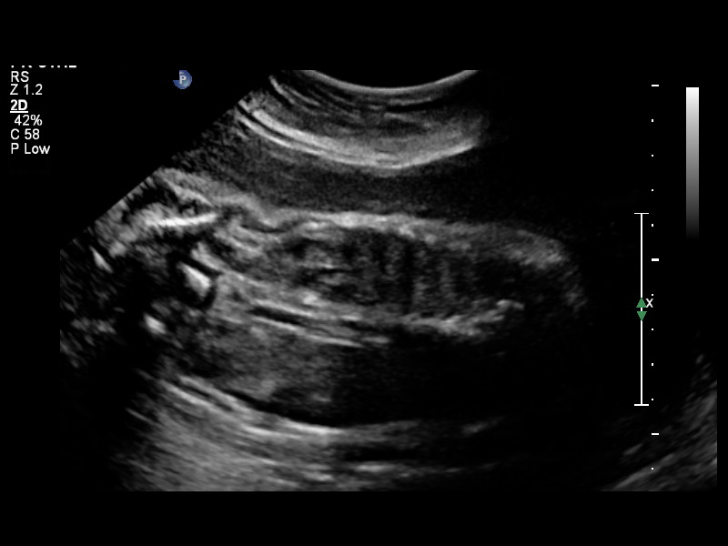
[im 7/15]
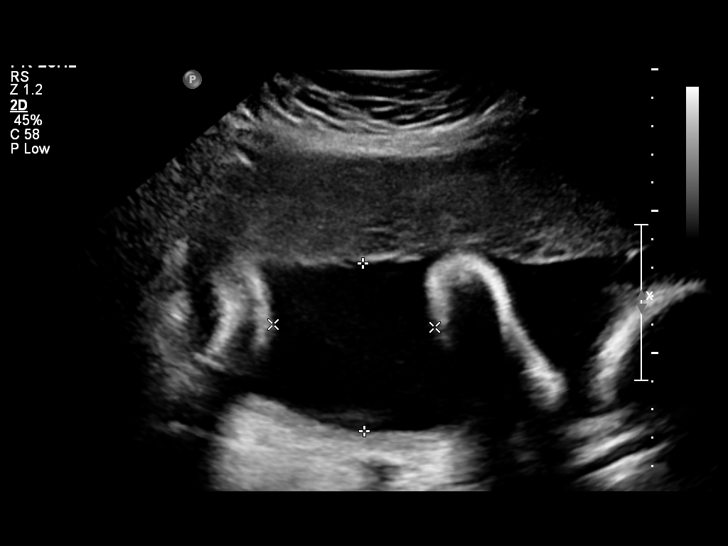
[im 8/15]
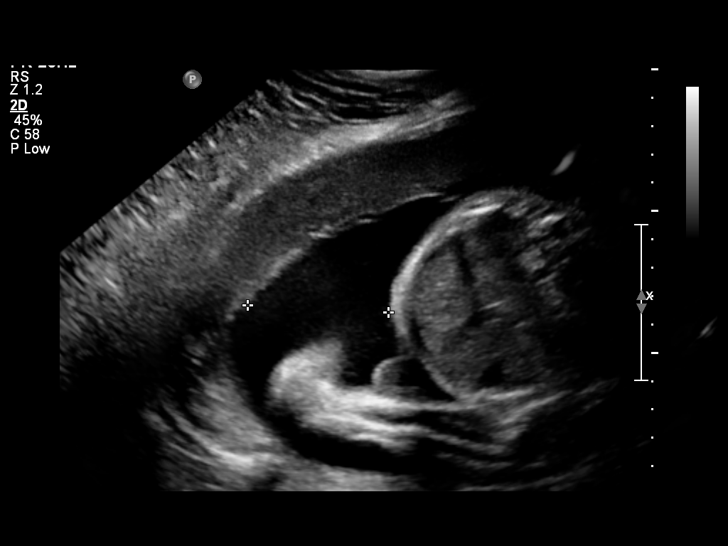
[im 9/15]
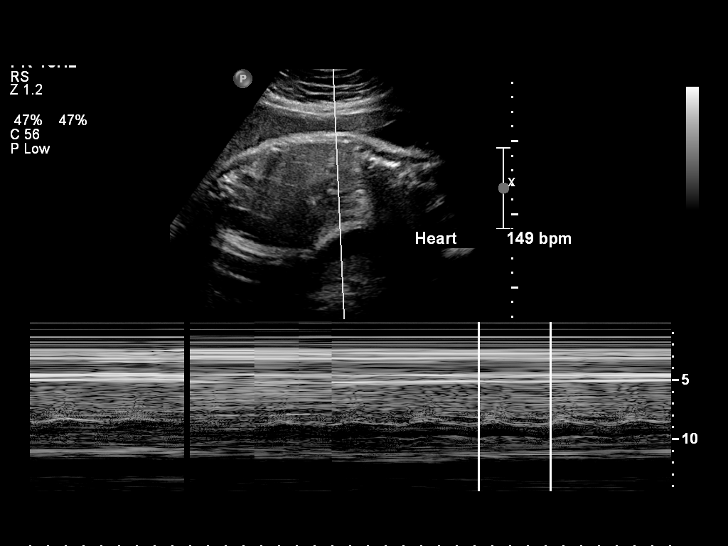
[im 10/15]
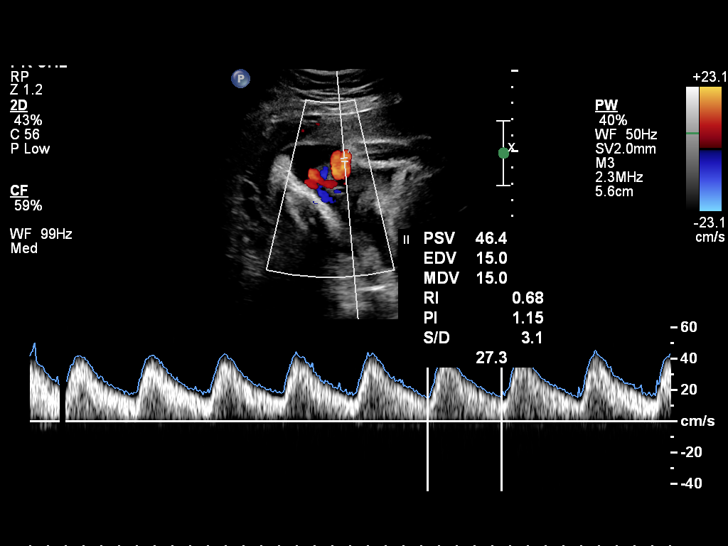
[im 11/15]
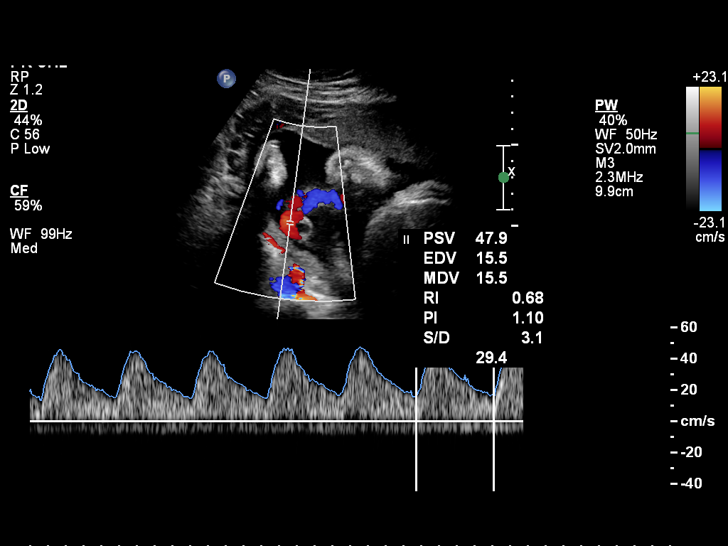
[im 13/15]
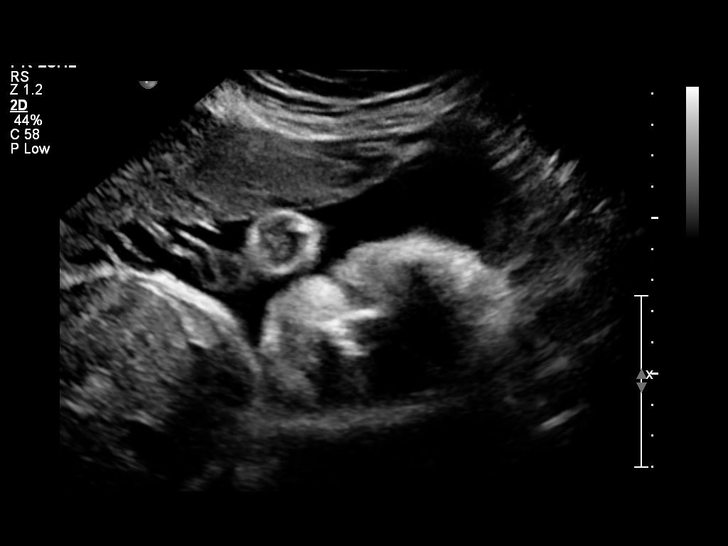
[im 14/15]
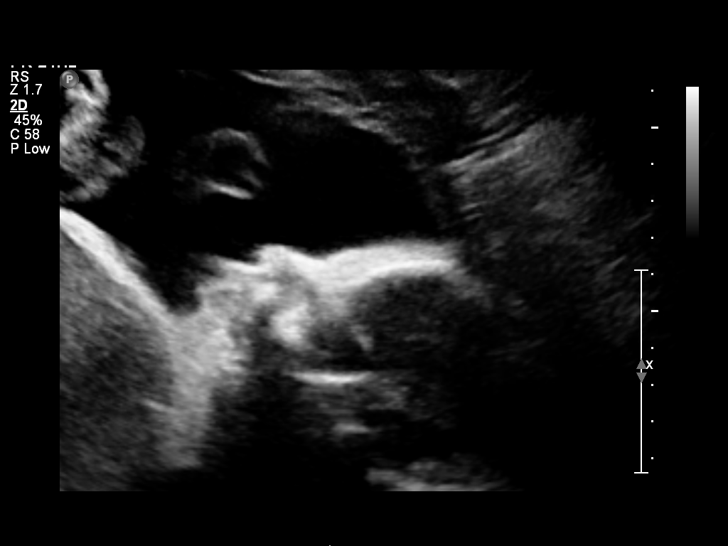
[im 15/15]
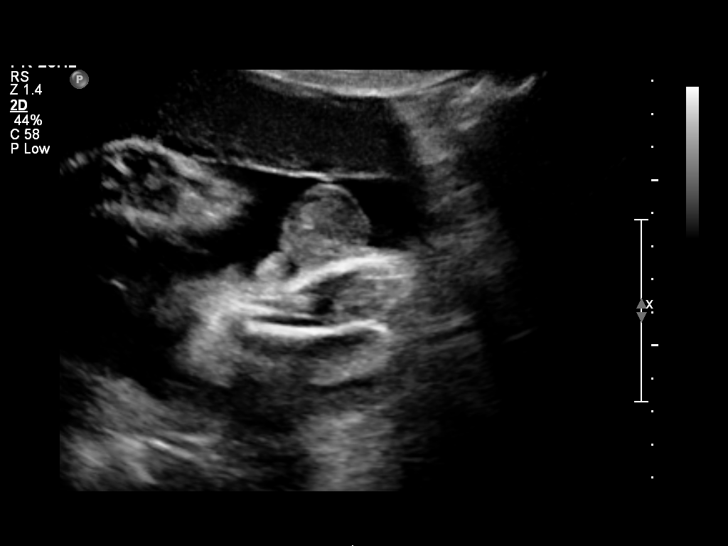

[13 of 15 positions shown; findings below may reference images not displayed]

OBSTETRICS REPORT
                      (Signed Final 04/27/2014 [DATE])

Service(s) Provided

 US UA CORD DOPPLER                                    76820.0
Indications

 Pre-eclampsia
Fetal Evaluation

 Num Of Fetuses:    1
 Fetal Heart Rate:  149                          bpm
 Cardiac Activity:  Observed
 Presentation:      Cephalic
 Placenta:          Anterior, above cervical os
 P. Cord            Previously Visualized
 Insertion:

 Amniotic Fluid
 AFI FV:      Subjectively within normal limits
                                             Larg Pckt:     5.9  cm
Biophysical Evaluation

 Amniotic F.V:   Pocket => 2 cm two         F. Tone:         Observed
                 planes
 F. Movement:    Observed                   Score:           [DATE]
 F. Breathing:   Observed
Gestational Age

 LMP:           30w 5d        Date:  09/23/13                 EDD:   06/30/14
 Best:          30w 5d     Det. By:  LMP  (09/23/13)          EDD:   06/30/14
Doppler - Fetal Vessels

 Umbilical Artery
 S/D:   3              60  %tile       RI:
 PI:    1.11                           PSV:       44.9    cm/s
 Umbilical Artery
 Absent DFV:     No    Reverse DFV:    No

Comments

 Results given to Dr. Attia via telephone.
Impression

 Single living intrauterine pregnancy at 30 weeks 5 days.
 Normal amniotic fluid volume.
 BPP [DATE].
 Normal  umbilical artery Dopplers.
Recommendations

 Follow-up ultrasounds as clinically indicated.

 questions or concerns.
                Boring, Deyvi

## 2016-08-04 ENCOUNTER — Encounter (HOSPITAL_COMMUNITY): Payer: Self-pay | Admitting: Anesthesiology

## 2016-08-04 ENCOUNTER — Encounter (HOSPITAL_COMMUNITY): Payer: Self-pay

## 2016-08-07 MED ORDER — DEXTROSE 5 % IV SOLN
2.0000 g | INTRAVENOUS | Status: DC
  Filled 2016-08-07: qty 2

## 2016-08-07 NOTE — H&P (Signed)
Diana Simpson is an 30 y.o. female presents for surgical mngt of missed Ab.  No heavy vb or pain.  Menstrual History: Patient's last menstrual period was 05/21/2016.    Past Medical History:  Diagnosis Date  . Anxiety   . Depression   . Dyspnea    with anxiety  . GERD (gastroesophageal reflux disease)   . Hypertension 2015   pre eclampsia  . Medical history non-contributory     Past Surgical History:  Procedure Laterality Date  . CESAREAN SECTION N/A 04/28/2014   Procedure: CESAREAN SECTION;  Surgeon: Allena Katz, MD;  Location: [facility];  Service: Obstetrics;  Laterality: N/A;  . NO PAST SURGERIES      No family history on file.  Social History:  reports that she has never smoked. She has never used smokeless tobacco. She reports that she drinks alcohol. She reports that she does not use drugs.  Allergies:  Allergies  Allergen Reactions  . Sulfa Antibiotics Other (See Comments)    Childhood reaction    No prescriptions prior to admission.    ROS  AF, VSS Physical Exam  gen- NAD CV - RRR Lungs - clear Abd - soft, NT PV - deferred  PV [place]:  IUP @ 9 wks, no CM Hgb 12.5 Blood type A-  Assessment/Plan:  Missed Ab D&E  Diana Simpson 08/07/2016, 9:12 AM

## 2016-08-08 ENCOUNTER — Encounter (HOSPITAL_COMMUNITY): Admission: RE | Payer: Self-pay | Source: Ambulatory Visit

## 2016-08-08 ENCOUNTER — Ambulatory Visit (HOSPITAL_COMMUNITY)
Admission: RE | Admit: 2016-08-08 | Payer: Managed Care, Other (non HMO) | Source: Ambulatory Visit | Admitting: Obstetrics and Gynecology

## 2016-08-08 HISTORY — DX: Gastro-esophageal reflux disease without esophagitis: K21.9

## 2016-08-08 SURGERY — DILATION AND EVACUATION, UTERUS
Anesthesia: Choice

## 2016-12-08 ENCOUNTER — Telehealth: Payer: Self-pay | Admitting: Hematology

## 2016-12-08 ENCOUNTER — Encounter: Payer: Self-pay | Admitting: Hematology

## 2016-12-08 NOTE — Telephone Encounter (Signed)
Appt has been scheduled for the pt to see Dr. Irene Limbo on 5/9 at 3pm. Pt aware to arrive 30 minutes. Voiced understanding. Demographics verified. Letter mailed to the pt.

## 2016-12-21 ENCOUNTER — Telehealth: Payer: Self-pay | Admitting: Hematology

## 2016-12-21 NOTE — Telephone Encounter (Signed)
I called patient to let her know of the appointment chang and she informed me that she was [redacted] weeks pregnant and that she really wanted to see Dr Irene Limbo or someone sooner due to treatment during her pregnancy.  Please call patient if she can be seen sooner than 6/6

## 2016-12-21 NOTE — Telephone Encounter (Signed)
Spoke with MD Irene Limbo about this patient. Due to volume of patients, he requests that this patient be seen with another provider if 01/24/17 is not okay.

## 2016-12-27 ENCOUNTER — Telehealth: Payer: Self-pay | Admitting: Hematology

## 2016-12-27 ENCOUNTER — Ambulatory Visit (HOSPITAL_BASED_OUTPATIENT_CLINIC_OR_DEPARTMENT_OTHER): Payer: Managed Care, Other (non HMO) | Admitting: Hematology

## 2016-12-27 VITALS — BP 113/84 | HR 77 | Temp 98.5°F | Resp 18 | Ht 61.0 in | Wt 180.4 lb

## 2016-12-27 DIAGNOSIS — D6851 Activated protein C resistance: Secondary | ICD-10-CM

## 2016-12-27 DIAGNOSIS — Z1589 Genetic susceptibility to other disease: Secondary | ICD-10-CM

## 2016-12-27 DIAGNOSIS — Z3A00 Weeks of gestation of pregnancy not specified: Secondary | ICD-10-CM | POA: Diagnosis not present

## 2016-12-27 DIAGNOSIS — Z8759 Personal history of other complications of pregnancy, childbirth and the puerperium: Secondary | ICD-10-CM | POA: Diagnosis not present

## 2016-12-27 DIAGNOSIS — E7212 Methylenetetrahydrofolate reductase deficiency: Secondary | ICD-10-CM

## 2016-12-27 NOTE — Telephone Encounter (Signed)
No LOS 5/9.

## 2016-12-28 ENCOUNTER — Encounter: Payer: Self-pay | Admitting: Hematology

## 2017-01-03 NOTE — Progress Notes (Signed)
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  HISTORY OF PRESENTING ILLNESS:   Diana Simpson is a wonderful 31 y.o. female who has been referred to [place] by Dr Julien Girt MD  for evaluation and recommendation regarding management of this patient with a h/o 1 miscarriage and newly noted hereditary thrombophilia (FVL heterozygous mutation) and (compund heterozygous MTHFR gene mutation -unclear significance).  Patient had her first pregnancy 2-1/2 years ago when she delivered by emergency C-section at 31.5 weeks due to preeclampsia with increased blood pressure isn't dropping platelet counts.  Patient had a miscarriage in mid December 2017 at about 8-1/2-9 weeks. Products of conception her past at home and were not analyzed chromosomal abnormalities.  Patient is now currently pregnant with an expected date of delivery around Christmastime 2018. She notes that she was on aspirin prior to pregnancy and has continued this. She reports her progesterone levels were noted to be low and she is on progesterone suppositories. Her GYN doctor has also started her on prophylaxis with Lovenox 40 mg subcutaneous to enhance daily.  She notes no issues with bleeding.  No prior personal history of venous thromboembolism. Has some family history with a maternal aunt having had a pulmonary embolism at age 66 years. Patient has been a lifelong nonsmoker. Was previously on oral contraceptive pills from age 44 to age 64 years without triggering of venous thromboembolic event.  MEDICAL HISTORY:  Past Medical History:  Diagnosis Date  . Anxiety   . Depression   . Dyspnea    with anxiety  . GERD (gastroesophageal reflux disease)   . Hypertension 2015   pre  eclampsia  . Medical history non-contributory     SURGICAL HISTORY: Past Surgical History:  Procedure Laterality Date  . CESAREAN SECTION N/A 04/28/2014   Procedure: CESAREAN SECTION;  Surgeon: Allena Katz, MD;  Location: [facility];  Service: Obstetrics;  Laterality: N/A;  . NO PAST SURGERIES      SOCIAL HISTORY: Social History   Social History  . Marital status: Married    Spouse name: N/A  . Number of children: N/A  . Years of education: N/A   Occupational History  . Not on file.   Social History Main Topics  . Smoking status: Never Smoker  . Smokeless tobacco: Never Used  . Alcohol use Yes     Comment: None since pregnancy  . Drug use: No  . Sexual activity: Yes    Birth control/ protection: None   Other Topics Concern  . Not on file   Social History Narrative  . No narrative on file    FAMILY HISTORY: History reviewed. No pertinent family history.  ALLERGIES:  is allergic to sulfa antibiotics.  MEDICATIONS:  Current Outpatient Prescriptions  Medication Sig Dispense Refill  . acetaminophen (TYLENOL) 325 MG tablet Take 650 mg by mouth every 6 (six) hours as needed for headache.    Marland Kitchen aspirin 81 MG tablet Take 81 mg by mouth daily.    . calcium carbonate (TUMS - DOSED IN MG ELEMENTAL CALCIUM) 500 MG chewable tablet Chew 2 tablets by mouth 2 (two) times daily as needed for indigestion or heartburn.    . enoxaparin (LOVENOX) 40 MG/0.4ML injection TAKE 1 INJECTION SUB Q, DAILY....  3  .  esomeprazole (NEXIUM) 20 MG capsule Take 20 mg by mouth daily.     . Prenatal Vit-Fe Fumarate-FA (PRENATAL MULTIVITAMIN) TABS tablet Take 1 tablet by mouth daily.     . progesterone (ENDOMETRIN) 100 MG vaginal insert Place 100 mg vaginally 2 (two) times daily.    . sertraline (ZOLOFT) 100 MG tablet Take 1 tablet (100 mg total) by mouth daily. (Patient taking differently: Take 200 mg by mouth daily. ) 30 tablet 4   No current facility-administered medications for this visit.      REVIEW OF SYSTEMS:    10 Point review of Systems was done is negative except as noted above.  PHYSICAL EXAMINATION: ECOG PERFORMANCE STATUS: 0 - Asymptomatic  . Vitals:   12/27/16 1459  BP: 113/84  Pulse: 77  Resp: 18  Temp: 98.5 F (36.9 C)   Filed Weights   12/27/16 1459  Weight: 180 lb 6.4 oz (81.8 kg)   .Body mass index is 34.09 kg/m.  GENERAL:alert, in no acute distress and comfortable SKIN: no acute rashes, no significant lesions EYES: conjunctiva are pink and non-injected, sclera anicteric OROPHARYNX: MMM, no exudates, no oropharyngeal erythema or ulceration NECK: supple, no JVD LYMPH:  no palpable lymphadenopathy in the cervical, axillary or inguinal regions LUNGS: clear to auscultation b/l with normal respiratory effort HEART: regular rate & rhythm ABDOMEN:  normoactive bowel sounds , non tender, not distended. Extremity: no pedal edema PSYCH: alert & oriented x 3 with fluent speech NEURO: no focal motor/sensory deficits  LABORATORY DATA:  I have reviewed the data as listed  . CBC Latest Ref Rng & Units 05/01/2014 04/30/2014 04/29/2014  WBC 4.0 - 10.5 K/uL 9.2 7.6 7.0  Hemoglobin 12.0 - 15.0 g/dL 9.5(L) 10.1(L) 9.1(L)  Hematocrit 36.0 - 46.0 % 28.0(L) 29.7(L) 26.7(L)  Platelets 150 - 400 K/uL 134(L) 111(L) 99(L)    . CMP Latest Ref Rng & Units 05/01/2014 04/30/2014 04/29/2014  Glucose 70 - 99 mg/dL 76 72 112(H)  BUN 6 - 23 mg/dL 9 8 11   Creatinine 0.50 - 1.10 mg/dL 0.74 0.71 0.80  Sodium 137 - 147 mEq/L 141 139 134(L)  Potassium 3.7 - 5.3 mEq/L 4.5 4.5 4.7  Chloride 96 - 112 mEq/L 105 104 100  CO2 19 - 32 mEq/L 24 26 25   Calcium 8.4 - 10.5 mg/dL 7.9(L) 6.9(L) 7.1(L)  Total Protein 6.0 - 8.3 g/dL 5.0(L) 4.6(L) 4.5(L)  Total Bilirubin 0.3 - 1.2 mg/dL 0.3 <0.2(L) 0.3  Alkaline Phos 39 - 117 U/L 74 76 71  AST 0 - 37 U/L 27 29 41(H)  ALT 0 - 35 U/L 23 24 28      RADIOGRAPHIC STUDIES: I have personally reviewed the radiological images as listed and  agreed with the findings in the report. No results found.  ASSESSMENT & PLAN:   31 year old Caucasian female with  #1 history of one miscarriage at about 9 weeks in December 2017  #2 first pregnancy had preeclampsia with preterm emergency C-section at 31.5 weeks. #3 patient is currently pregnant with high risk pregnancy given her history of miscarriage. Also noted to have a low progesterone levels requiring replacement. #4 heterozygous factor V Leiden mutation #5 compound heterozygous MTHFR of unclear significance from a thromboembolism and miscarriage standpoint. Plan -I discussed the characteristics of the patient's thrombophilia. Her heterozygous factor V Leiden mutation at steady state posterior 4-7 fold increased risk of venous thromboembolism compared to age-matched general population. -She has never had a personal history of venous thromboembolism despite being on oral contraceptive  pills for a fairly long length of time. -We discussed that it is unclear if her miscarriage in December 2017 at gestational age of [redacted] weeks was related to her thrombophilia. Thrombophilia as are typically more strongly associated with late second and third trimester miscarriages. However cannot rule out the possibility that her thrombophilia could be a factor. -After discussing the pros and cons and the lack of certainty that the thrombophilia is related to her miscarriage she is agreeable to continue her prophylactic Lovenox. -Would recommend increasing Lovenox 60 mg subcutaneously daily to be continued throughout pregnancy and for 6 weeks postpartum. -Unclear role for low-dose aspirin in addition to Lovenox prophylaxis. Will let her OB/GYN doctor decide the merits of aspirin in addition to the Lovenox. -Continue close follow-up with GYN for high risk pregnancy. -Would not recommend estrogen based hormonal contraception in the future.  Continued follow-up with primary and OB/GYN. Currently reconsult [place] if  any new questions or concerns arise.   All of the patients questions were answered with apparent satisfaction. The patient knows to call the clinic with any problems, questions or concerns.  I spent 45 minutes counseling the patient face to face. The total time spent in the appointment was 60 minutes and more than 50% was on counseling and direct patient cares.    Sullivan Lone MD [place] AAHIVMS [facility] Hematology/Oncology Physician [facility]  (Office):       [phone] (Work cell):  [phone] (Fax):           [phone]

## 2017-01-17 LAB — OB RESULTS CONSOLE HIV ANTIBODY (ROUTINE TESTING): HIV: NONREACTIVE

## 2017-01-17 LAB — OB RESULTS CONSOLE RUBELLA ANTIBODY, IGM: Rubella: IMMUNE

## 2017-01-17 LAB — OB RESULTS CONSOLE RPR: RPR: NONREACTIVE

## 2017-01-17 LAB — OB RESULTS CONSOLE HEPATITIS B SURFACE ANTIGEN: Hepatitis B Surface Ag: NEGATIVE

## 2017-07-05 ENCOUNTER — Encounter (HOSPITAL_COMMUNITY): Payer: Self-pay | Admitting: *Deleted

## 2017-07-05 ENCOUNTER — Inpatient Hospital Stay (HOSPITAL_COMMUNITY)
Admission: AD | Admit: 2017-07-05 | Discharge: 2017-07-05 | Disposition: A | Discharge status: Home / Self Care | Payer: Managed Care, Other (non HMO) | Source: Ambulatory Visit | Attending: Obstetrics & Gynecology | Admitting: Obstetrics & Gynecology

## 2017-07-05 DIAGNOSIS — Z882 Allergy status to sulfonamides status: Secondary | ICD-10-CM | POA: Diagnosis not present

## 2017-07-05 DIAGNOSIS — O99613 Diseases of the digestive system complicating pregnancy, third trimester: Secondary | ICD-10-CM | POA: Diagnosis not present

## 2017-07-05 DIAGNOSIS — Z7982 Long term (current) use of aspirin: Secondary | ICD-10-CM | POA: Insufficient documentation

## 2017-07-05 DIAGNOSIS — Z3A33 33 weeks gestation of pregnancy: Secondary | ICD-10-CM | POA: Insufficient documentation

## 2017-07-05 DIAGNOSIS — K219 Gastro-esophageal reflux disease without esophagitis: Secondary | ICD-10-CM | POA: Insufficient documentation

## 2017-07-05 DIAGNOSIS — O133 Gestational [pregnancy-induced] hypertension without significant proteinuria, third trimester: Secondary | ICD-10-CM | POA: Insufficient documentation

## 2017-07-05 DIAGNOSIS — Z79899 Other long term (current) drug therapy: Secondary | ICD-10-CM | POA: Insufficient documentation

## 2017-07-05 DIAGNOSIS — Z7901 Long term (current) use of anticoagulants: Secondary | ICD-10-CM | POA: Insufficient documentation

## 2017-07-05 DIAGNOSIS — O34219 Maternal care for unspecified type scar from previous cesarean delivery: Secondary | ICD-10-CM | POA: Insufficient documentation

## 2017-07-05 DIAGNOSIS — F419 Anxiety disorder, unspecified: Secondary | ICD-10-CM | POA: Insufficient documentation

## 2017-07-05 DIAGNOSIS — O99343 Other mental disorders complicating pregnancy, third trimester: Secondary | ICD-10-CM | POA: Insufficient documentation

## 2017-07-05 DIAGNOSIS — I10 Essential (primary) hypertension: Secondary | ICD-10-CM | POA: Diagnosis present

## 2017-07-05 DIAGNOSIS — F329 Major depressive disorder, single episode, unspecified: Secondary | ICD-10-CM | POA: Insufficient documentation

## 2017-07-05 HISTORY — DX: Unspecified pre-eclampsia, unspecified trimester: O14.90

## 2017-07-05 LAB — COMPREHENSIVE METABOLIC PANEL
ALBUMIN: 2.8 g/dL — AB (ref 3.5–5.0)
ALT: 11 U/L — AB (ref 14–54)
AST: 18 U/L (ref 15–41)
Alkaline Phosphatase: 156 U/L — ABNORMAL HIGH (ref 38–126)
Anion gap: 4 — ABNORMAL LOW (ref 5–15)
BILIRUBIN TOTAL: 0.6 mg/dL (ref 0.3–1.2)
BUN: 9 mg/dL (ref 6–20)
CHLORIDE: 104 mmol/L (ref 101–111)
CO2: 24 mmol/L (ref 22–32)
CREATININE: 0.69 mg/dL (ref 0.44–1.00)
Calcium: 8.3 mg/dL — ABNORMAL LOW (ref 8.9–10.3)
GFR calc Af Amer: >60 mL/min (ref >60)
GLUCOSE: 108 mg/dL — AB (ref 65–99)
Potassium: 4.2 mmol/L (ref 3.5–5.1)
Sodium: 132 mmol/L — ABNORMAL LOW (ref 135–145)
Total Protein: 5.9 g/dL — ABNORMAL LOW (ref 6.5–8.1)

## 2017-07-05 LAB — PROTEIN / CREATININE RATIO, URINE
CREATININE, URINE: 193 mg/dL
Protein Creatinine Ratio: 0.23 mg/mg{Cre} — ABNORMAL HIGH (ref 0.00–0.15)
Total Protein, Urine: 44 mg/dL

## 2017-07-05 LAB — CBC
HEMATOCRIT: 31.7 % — AB (ref 36.0–46.0)
Hemoglobin: 10.2 g/dL — ABNORMAL LOW (ref 12.0–15.0)
MCH: 26.2 pg (ref 26.0–34.0)
MCHC: 32.2 g/dL (ref 30.0–36.0)
MCV: 81.5 fL (ref 78.0–100.0)
PLATELETS: 149 10*3/uL — AB (ref 150–400)
RBC: 3.89 MIL/uL (ref 3.87–5.11)
RDW: 14.1 % (ref 11.5–15.5)
WBC: 8.1 10*3/uL (ref 4.0–10.5)

## 2017-07-05 NOTE — MAU Provider Note (Signed)
History     CSN: [id number]  Arrival date and time: 07/05/17 1317   First Provider Initiated Contact with Patient 07/05/17 1428      Chief Complaint  Patient presents with  . Hypertension  . Headache   HPI Diana Simpson is a 31 y.o. G2P0101 at [redacted]w[redacted]d who presents for BP evaluation. Has cuff at home & noticed elevated BPs since last night. BPs up to 140s/80s. History of preeclampsia in last pregnancy; no hypertension since then. Endorses "mild" headache that she rates 1/10. Has not treated. Some blurred vision in right eye since yesterday. Denies epigastric pain. Positive fetal movement.   OB History    Gravida Para Term Preterm AB Living   2 1   1   1    SAB TAB Ectopic Multiple Live Births           1      Past Medical History:  Diagnosis Date  . Anxiety   . Depression   . Dyspnea    with anxiety  . GERD (gastroesophageal reflux disease)   . Hypertension 2015   pre eclampsia  . Medical history non-contributory     Past Surgical History:  Procedure Laterality Date  . CESAREAN SECTION N/A 04/28/2014   Procedure: CESAREAN SECTION;  Surgeon: Allena Katz, MD;  Location: [facility];  Service: Obstetrics;  Laterality: N/A;  . NO PAST SURGERIES      History reviewed. No pertinent family history.  Social History   Tobacco Use  . Smoking status: Never Smoker  . Smokeless tobacco: Never Used  Substance Use Topics  . Alcohol use: Yes    Comment: None since pregnancy  . Drug use: No    Allergies:  Allergies  Allergen Reactions  . Sulfa Antibiotics Other (See Comments)    Childhood reaction    Medications Prior to Admission  Medication Sig Dispense Refill Last Dose  . acetaminophen (TYLENOL) 325 MG tablet Take 650 mg by mouth every 6 (six) hours as needed for headache.   Taking  . aspirin 81 MG tablet Take 81 mg by mouth daily.   Taking  . calcium carbonate (TUMS - DOSED IN MG ELEMENTAL CALCIUM) 500 MG chewable tablet Chew 2 tablets by mouth 2 (two) times daily  as needed for indigestion or heartburn.   Taking  . enoxaparin (LOVENOX) 40 MG/0.4ML injection TAKE 1 INJECTION SUB Q, DAILY....  3 Taking  . esomeprazole (NEXIUM) 20 MG capsule Take 20 mg by mouth daily.    Taking  . Prenatal Vit-Fe Fumarate-FA (PRENATAL MULTIVITAMIN) TABS tablet Take 1 tablet by mouth daily.    Taking  . progesterone (ENDOMETRIN) 100 MG vaginal insert Place 100 mg vaginally 2 (two) times daily.   Taking  . sertraline (ZOLOFT) 100 MG tablet Take 1 tablet (100 mg total) by mouth daily. (Patient taking differently: Take 200 mg by mouth daily. ) 30 tablet 4 Taking    Review of Systems  Constitutional: Negative.   Eyes: Positive for visual disturbance. Negative for photophobia.  Respiratory: Negative.   Cardiovascular: Negative.   Gastrointestinal: Negative.   Genitourinary: Negative.   Neurological: Positive for headaches.   Physical Exam   Blood pressure (!) 144/94, pulse 91, temperature 98.8 F (37.1 C), temperature source Oral, resp. rate 18, height 5\' 2"  (1.575 m), weight 198 lb (89.8 kg), SpO2 100 %, unknown if currently breastfeeding.  Physical Exam  Nursing note and vitals reviewed. Constitutional: She is oriented to person, place, and time. She appears well-developed  and well-nourished. No distress.  HENT:  Head: Normocephalic and atraumatic.  Eyes: Conjunctivae are normal. Right eye exhibits no discharge. Left eye exhibits no discharge. No scleral icterus.  Neck: Normal range of motion.  Cardiovascular: Normal rate, regular rhythm and normal heart sounds.  No murmur heard. Respiratory: Effort normal and breath sounds normal. No respiratory distress. She has no wheezes.  GI: Soft. There is no tenderness.  Musculoskeletal: She exhibits edema (BLE, 2+ pitting, equal bilaterally).  Neurological: She is alert and oriented to person, place, and time. She has normal reflexes.  No clonus  Skin: Skin is warm and dry. She is not diaphoretic.  Psychiatric: She has a  normal mood and affect. Her behavior is normal. Judgment and thought content normal.    MAU Course  Procedures Results for orders placed or performed during the hospital encounter of 07/05/17 (from the past 24 hour(s))  Protein / creatinine ratio, urine     Status: Abnormal   Collection Time: 07/05/17  1:40 PM  Result Value Ref Range   Creatinine, Urine 193.00 mg/dL   Total Protein, Urine 44 mg/dL   Protein Creatinine Ratio 0.23 (H) 0.00 - 0.15 mg/mg[Cre]  CBC     Status: Abnormal   Collection Time: 07/05/17  2:02 PM  Result Value Ref Range   WBC 8.1 4.0 - 10.5 K/uL   RBC 3.89 3.87 - 5.11 MIL/uL   Hemoglobin 10.2 (L) 12.0 - 15.0 g/dL   HCT 31.7 (L) 36.0 - 46.0 %   MCV 81.5 78.0 - 100.0 fL   MCH 26.2 26.0 - 34.0 pg   MCHC 32.2 30.0 - 36.0 g/dL   RDW 14.1 11.5 - 15.5 %   Platelets 149 (L) 150 - 400 K/uL  Comprehensive metabolic panel     Status: Abnormal   Collection Time: 07/05/17  2:02 PM  Result Value Ref Range   Sodium 132 (L) 135 - 145 mmol/L   Potassium 4.2 3.5 - 5.1 mmol/L   Chloride 104 101 - 111 mmol/L   CO2 24 22 - 32 mmol/L   Glucose, Bld 108 (H) 65 - 99 mg/dL   BUN 9 6 - 20 mg/dL   Creatinine, Ser 0.69 0.44 - 1.00 mg/dL   Calcium 8.3 (L) 8.9 - 10.3 mg/dL   Total Protein 5.9 (L) 6.5 - 8.1 g/dL   Albumin 2.8 (L) 3.5 - 5.0 g/dL   AST 18 15 - 41 U/L   ALT 11 (L) 14 - 54 U/L   Alkaline Phosphatase 156 (H) 38 - 126 U/L   Total Bilirubin 0.6 0.3 - 1.2 mg/dL   GFR calc non Af Amer >60 >60 mL/min   GFR calc Af Amer >60 >60 mL/min   Anion gap 4 (L) 5 - 15    MDM NST:  Baseline: 145 bpm, Variability: Good {> 6 bpm), Accelerations: Reactive, Decelerations: Absent and no ctx   No severe range BPs. Elevated BP x 2, otherwise normal CBC, CMP, urine PCR C/w Dr. Lynnette Caffey. Notified of VS, labs, & pt complaints. Ok to discharge home with f/u in office on Monday.  Assessment and Plan  A:  1. Gestational hypertension w/o significant proteinuria in 3rd trimester   2. [redacted]  weeks gestation of pregnancy    P: Discharge home Strict return precautions r/t preeclampsia Schedule f/u in office on Monday for repeat labs & BP check   Jorje Guild 07/05/2017, 2:29 PM

## 2017-07-05 NOTE — MAU Note (Signed)
Seen in office this am for elevated b/p and headache. Sent over for eval

## 2017-07-05 NOTE — Discharge Instructions (Signed)
Hypertension During Pregnancy °Hypertension, commonly called high blood pressure, is when the force of blood pumping through your arteries is too strong. Arteries are blood vessels that carry blood from the heart throughout the body. Hypertension during pregnancy can cause problems for you and your baby. Your baby may be born early (prematurely) or may not weigh as much as he or she should at birth. Very bad cases of hypertension during pregnancy can be life-threatening. °Different types of hypertension can occur during pregnancy. These include: °· Chronic hypertension. This happens when: °? You have hypertension before pregnancy and it continues during pregnancy. °? You develop hypertension before you are [redacted] weeks pregnant, and it continues during pregnancy. °· Gestational hypertension. This is hypertension that develops after the 20th week of pregnancy. °· Preeclampsia, also called toxemia of pregnancy. This is a very serious type of hypertension that develops only during pregnancy. It affects the whole body, and it can be very dangerous for you and your baby. ° °Gestational hypertension and preeclampsia usually go away within 6 weeks after your baby is born. Women who have hypertension during pregnancy have a greater chance of developing hypertension later in life or during future pregnancies. °What are the causes? °The exact cause of hypertension is not known. °What increases the risk? °There are certain factors that make it more likely for you to develop hypertension during pregnancy. These include: °· Having hypertension during a previous pregnancy or prior to pregnancy. °· Being overweight. °· Being older than age 40. °· Being pregnant for the first time or being pregnant with more than one baby. °· Becoming pregnant using fertilization methods such as IVF (in vitro fertilization). °· Having diabetes, kidney problems, or systemic lupus erythematosus. °· Having a family history of hypertension. ° °What are the  signs or symptoms? °Chronic hypertension and gestational hypertension rarely cause symptoms. Preeclampsia causes symptoms, which may include: °· Increased protein in your urine. Your health care provider will check for this at every visit before you give birth (prenatal visit). °· Severe headaches. °· Sudden weight gain. °· Swelling of the hands, face, legs, and feet. °· Nausea and vomiting. °· Vision problems, such as blurred or double vision. °· Numbness in the face, arms, legs, and feet. °· Dizziness. °· Slurred speech. °· Sensitivity to bright lights. °· Abdominal pain. °· Convulsions. ° °How is this diagnosed? °You may be diagnosed with hypertension during a routine prenatal exam. At each prenatal visit, you may: °· Have a urine test to check for high amounts of protein in your urine. °· Have your blood pressure checked. A blood pressure reading is recorded as two numbers, such as "120 over 80" (or 120/80). The first ("top") number is called the systolic pressure. It is a measure of the pressure in your arteries when your heart beats. The second ("bottom") number is called the diastolic pressure. It is a measure of the pressure in your arteries as your heart relaxes between beats. Blood pressure is measured in a unit called mm Hg. A normal blood pressure reading is: °? Systolic: below 120. °? Diastolic: below 80. ° °The type of hypertension that you are diagnosed with depends on your test results and when your symptoms developed. °· Chronic hypertension is usually diagnosed before 20 weeks of pregnancy. °· Gestational hypertension is usually diagnosed after 20 weeks of pregnancy. °· Hypertension with high amounts of protein in the urine is diagnosed as preeclampsia. °· Blood pressure measurements that stay above 160 systolic, or above 110 diastolic, are   signs of severe preeclampsia. ° °How is this treated? °Treatment for hypertension during pregnancy varies depending on the type of hypertension you have and how  serious it is. °· If you take medicines called ACE inhibitors to treat chronic hypertension, you may need to switch medicines. ACE inhibitors should not be taken during pregnancy. °· If you have gestational hypertension, you may need to take blood pressure medicine. °· If you are at risk for preeclampsia, your health care provider may recommend that you take a low-dose aspirin every day to prevent high blood pressure during your pregnancy. °· If you have severe preeclampsia, you may need to be hospitalized so you and your baby can be monitored closely. You may also need to take medicine (magnesium sulfate) to prevent seizures and to lower blood pressure. This medicine may be given as an injection or through an IV tube. °· In some cases, if your condition gets worse, you may need to deliver your baby early. ° °Follow these instructions at home: °Eating and drinking °· Drink enough fluid to keep your urine clear or pale yellow. °· Eat a healthy diet that is low in salt (sodium). Do not add salt to your food. Check food labels to see how much sodium a food or beverage contains. °Lifestyle °· Do not use any products that contain nicotine or tobacco, such as cigarettes and e-cigarettes. If you need help quitting, ask your health care provider. °· Do not use alcohol. °· Avoid caffeine. °· Avoid stress as much as possible. Rest and get plenty of sleep. °General instructions °· Take over-the-counter and prescription medicines only as told by your health care provider. °· While lying down, lie on your left side. This keeps pressure off your baby. °· While sitting or lying down, raise (elevate) your feet. Try putting some pillows under your lower legs. °· Exercise regularly. Ask your health care provider what kinds of exercise are best for you. °· Keep all prenatal and follow-up visits as told by your health care provider. This is important. °Contact a health care provider if: °· You have symptoms that your health care  provider told you may require more treatment or monitoring, such as: °? Fever. °? Vomiting. °? Headache. °Get help right away if: °· You have severe abdominal pain or vomiting that does not get better with treatment. °· You suddenly develop swelling in your hands, ankles, or face. °· You gain 4 lbs (1.8 kg) or more in 1 week. °· You develop vaginal bleeding, or you have blood in your urine. °· You do not feel your baby moving as much as usual. °· You have blurred or double vision. °· You have muscle twitching or sudden tightening (spasms). °· You have shortness of breath. °· Your lips or fingernails turn blue. °This information is not intended to replace advice given to you by your health care provider. Make sure you discuss any questions you have with your health care provider. °Document Released: 04/25/2011 Document Revised: 02/25/2016 Document Reviewed: 01/21/2016 °Elsevier Interactive Patient Education © 2018 [employer] ° °

## 2017-07-13 ENCOUNTER — Encounter (HOSPITAL_COMMUNITY): Payer: Self-pay | Admitting: *Deleted

## 2017-07-13 ENCOUNTER — Inpatient Hospital Stay (EMERGENCY_DEPARTMENT_HOSPITAL)
Admission: AD | Admit: 2017-07-13 | Discharge: 2017-07-13 | Disposition: A | Discharge status: Home / Self Care | Payer: Managed Care, Other (non HMO) | Source: Ambulatory Visit | Attending: Obstetrics & Gynecology | Admitting: Obstetrics & Gynecology

## 2017-07-13 DIAGNOSIS — R200 Anesthesia of skin: Secondary | ICD-10-CM | POA: Insufficient documentation

## 2017-07-13 DIAGNOSIS — Z7901 Long term (current) use of anticoagulants: Secondary | ICD-10-CM

## 2017-07-13 DIAGNOSIS — F419 Anxiety disorder, unspecified: Secondary | ICD-10-CM

## 2017-07-13 DIAGNOSIS — M7989 Other specified soft tissue disorders: Secondary | ICD-10-CM | POA: Insufficient documentation

## 2017-07-13 DIAGNOSIS — O99613 Diseases of the digestive system complicating pregnancy, third trimester: Secondary | ICD-10-CM

## 2017-07-13 DIAGNOSIS — Z882 Allergy status to sulfonamides status: Secondary | ICD-10-CM | POA: Insufficient documentation

## 2017-07-13 DIAGNOSIS — K219 Gastro-esophageal reflux disease without esophagitis: Secondary | ICD-10-CM | POA: Insufficient documentation

## 2017-07-13 DIAGNOSIS — Z3A34 34 weeks gestation of pregnancy: Secondary | ICD-10-CM | POA: Diagnosis not present

## 2017-07-13 DIAGNOSIS — Z7982 Long term (current) use of aspirin: Secondary | ICD-10-CM

## 2017-07-13 DIAGNOSIS — O34219 Maternal care for unspecified type scar from previous cesarean delivery: Secondary | ICD-10-CM | POA: Insufficient documentation

## 2017-07-13 DIAGNOSIS — F329 Major depressive disorder, single episode, unspecified: Secondary | ICD-10-CM | POA: Insufficient documentation

## 2017-07-13 DIAGNOSIS — O163 Unspecified maternal hypertension, third trimester: Secondary | ICD-10-CM

## 2017-07-13 DIAGNOSIS — O1403 Mild to moderate pre-eclampsia, third trimester: Secondary | ICD-10-CM

## 2017-07-13 DIAGNOSIS — Z79899 Other long term (current) drug therapy: Secondary | ICD-10-CM

## 2017-07-13 DIAGNOSIS — O99343 Other mental disorders complicating pregnancy, third trimester: Secondary | ICD-10-CM

## 2017-07-13 DIAGNOSIS — O1414 Severe pre-eclampsia complicating childbirth: Secondary | ICD-10-CM | POA: Diagnosis not present

## 2017-07-13 DIAGNOSIS — R030 Elevated blood-pressure reading, without diagnosis of hypertension: Secondary | ICD-10-CM | POA: Diagnosis not present

## 2017-07-13 LAB — URINALYSIS, ROUTINE W REFLEX MICROSCOPIC
Bilirubin Urine: NEGATIVE
Glucose, UA: NEGATIVE mg/dL
Hgb urine dipstick: NEGATIVE
Ketones, ur: NEGATIVE mg/dL
Leukocytes, UA: NEGATIVE
Nitrite: NEGATIVE
Protein, ur: 100 mg/dL — AB
Specific Gravity, Urine: 1.026 (ref 1.005–1.030)
pH: 5 (ref 5.0–8.0)

## 2017-07-13 LAB — CBC
HCT: 29.6 % — ABNORMAL LOW (ref 36.0–46.0)
Hemoglobin: 9.5 g/dL — ABNORMAL LOW (ref 12.0–15.0)
MCH: 25.7 pg — ABNORMAL LOW (ref 26.0–34.0)
MCHC: 32.1 g/dL (ref 30.0–36.0)
MCV: 80.2 fL (ref 78.0–100.0)
Platelets: 145 10*3/uL — ABNORMAL LOW (ref 150–400)
RBC: 3.69 MIL/uL — ABNORMAL LOW (ref 3.87–5.11)
RDW: 14.4 % (ref 11.5–15.5)
WBC: 8.4 10*3/uL (ref 4.0–10.5)

## 2017-07-13 LAB — COMPREHENSIVE METABOLIC PANEL
ALT: 11 U/L — ABNORMAL LOW (ref 14–54)
AST: 17 U/L (ref 15–41)
Albumin: 2.6 g/dL — ABNORMAL LOW (ref 3.5–5.0)
Alkaline Phosphatase: 138 U/L — ABNORMAL HIGH (ref 38–126)
Anion gap: 9 (ref 5–15)
BUN: 12 mg/dL (ref 6–20)
CO2: 22 mmol/L (ref 22–32)
Calcium: 8.4 mg/dL — ABNORMAL LOW (ref 8.9–10.3)
Chloride: 105 mmol/L (ref 101–111)
Creatinine, Ser: 0.71 mg/dL (ref 0.44–1.00)
GFR calc Af Amer: >60 mL/min (ref >60)
GFR calc non Af Amer: >60 mL/min (ref >60)
Glucose, Bld: 102 mg/dL — ABNORMAL HIGH (ref 65–99)
Potassium: 4.6 mmol/L (ref 3.5–5.1)
Sodium: 136 mmol/L (ref 135–145)
Total Bilirubin: 0.4 mg/dL (ref 0.3–1.2)
Total Protein: 6.2 g/dL — ABNORMAL LOW (ref 6.5–8.1)

## 2017-07-13 LAB — PROTEIN / CREATININE RATIO, URINE
Creatinine, Urine: 276 mg/dL
Protein Creatinine Ratio: 0.67 mg/mg{Cre} — ABNORMAL HIGH (ref 0.00–0.15)
Total Protein, Urine: 185 mg/dL

## 2017-07-13 NOTE — MAU Provider Note (Signed)
History     CSN: [id number]  Arrival date and time: 07/13/17 1454   First Provider Initiated Contact with Patient 07/13/17 1550      Chief Complaint  Patient presents with  . Hypertension  . Numbness   HPI Diana Simpson 31 y.o. [redacted]w[redacted]d  Comes to MAU today with increased blood pressures noted on her home monitoring.  She says her head does not feel like pain but pressure behind her eyes and pushing forward.  She thinks the vision in her right eye is off from time to time and has noticed her right foot is tingling some.  Feet have been swelling especially when she is walking more.  Has rested a lot today.  OB History    Gravida Para Term Preterm AB Living   2 1   1   1    SAB TAB Ectopic Multiple Live Births           1      Past Medical History:  Diagnosis Date  . Anxiety   . Depression   . GERD (gastroesophageal reflux disease)   . Preeclampsia 2015  . Preterm delivery 2015   31 wks d/t preeclampsia    Past Surgical History:  Procedure Laterality Date  . CESAREAN SECTION N/A 04/28/2014   Procedure: CESAREAN SECTION;  Surgeon: Allena Katz, MD;  Location: [facility];  Service: Obstetrics;  Laterality: N/A;    History reviewed. No pertinent family history.  Social History   Tobacco Use  . Smoking status: Never Smoker  . Smokeless tobacco: Never Used  Substance Use Topics  . Alcohol use: Yes    Comment: None since pregnancy  . Drug use: No    Allergies:  Allergies  Allergen Reactions  . Sulfa Antibiotics Other (See Comments)    Childhood reaction    Medications Prior to Admission  Medication Sig Dispense Refill Last Dose  . acetaminophen (TYLENOL) 500 MG tablet Take 500 mg by mouth every 6 (six) hours as needed for mild pain or headache.   07/12/2017 at Unknown time  . aspirin 81 MG tablet Take 81 mg by mouth daily.   07/12/2017 at Unknown time  . calcium carbonate (TUMS - DOSED IN MG ELEMENTAL CALCIUM) 500 MG chewable tablet Chew 2 tablets by mouth 2 (two)  times daily as needed for indigestion or heartburn.   07/12/2017 at Unknown time  . enoxaparin (LOVENOX) 40 MG/0.4ML injection Inject 40 mg at bedtime into the skin.   07/12/2017 at 2230  . esomeprazole (NEXIUM) 20 MG capsule Take 20 mg by mouth daily.    07/13/2017 at Unknown time  . Prenatal Vit-Fe Fumarate-FA (PRENATAL MULTIVITAMIN) TABS tablet Take 1 tablet by mouth daily.    07/13/2017 at Unknown time  . sertraline (ZOLOFT) 100 MG tablet Take 1 tablet (100 mg total) by mouth daily. (Patient taking differently: Take 200 mg at bedtime by mouth. ) 30 tablet 4 07/12/2017 at Unknown time  . cyclobenzaprine (FLEXERIL) 10 MG tablet Take 10 mg at bedtime as needed by mouth for muscle spasms.    07/04/2017 at Unknown time    Review of Systems  Constitutional: Negative for fever.  Gastrointestinal: Negative for abdominal pain.  Genitourinary: Negative for vaginal bleeding and vaginal discharge.  Neurological: Negative for numbness and headaches.       Pressure but not pain in her head Tingling in right foot.   Physical Exam   Blood pressure (!) 146/84, pulse 74, temperature 98.4 F (36.9 C), resp.  rate 18, height 5' 2.5" (1.588 m), weight 203 lb (92.1 kg), unknown if currently breastfeeding.  Physical Exam  Nursing note and vitals reviewed. Constitutional: She is oriented to person, place, and time. She appears well-developed and well-nourished.  HENT:  Head: Normocephalic.  Eyes: EOM are normal.  Neck: Neck supple.  Respiratory: Effort normal.  GI: Soft. There is no tenderness. There is no rebound and no guarding.  FHT monitor - baseline 135 with moderate variability and 15x15 accels noted - reactive strip with no contractions, no decelerations (traced maternal several times)   Musculoskeletal: Normal range of motion.  No edema on ankles bilaterally  Neurological: She is alert and oriented to person, place, and time.  Skin: Skin is warm and dry.  Psychiatric: She has a normal mood and  affect.    MAU Course  Procedures Results for orders placed or performed during the hospital encounter of 07/13/17 (from the past 24 hour(s))  Urinalysis, Routine w reflex microscopic     Status: Abnormal   Collection Time: 07/13/17  3:12 PM  Result Value Ref Range   Color, Urine AMBER (A) YELLOW   APPearance HAZY (A) CLEAR   Specific Gravity, Urine 1.026 1.005 - 1.030   pH 5.0 5.0 - 8.0   Glucose, UA NEGATIVE NEGATIVE mg/dL   Hgb urine dipstick NEGATIVE NEGATIVE   Bilirubin Urine NEGATIVE NEGATIVE   Ketones, ur NEGATIVE NEGATIVE mg/dL   Protein, ur 100 (A) NEGATIVE mg/dL   Nitrite NEGATIVE NEGATIVE   Leukocytes, UA NEGATIVE NEGATIVE   RBC / HPF 0-5 0 - 5 RBC/hpf   WBC, UA 0-5 0 - 5 WBC/hpf   Bacteria, UA RARE (A) NONE SEEN   Squamous Epithelial / LPF 6-30 (A) NONE SEEN   Mucus PRESENT   Protein / creatinine ratio, urine     Status: Abnormal   Collection Time: 07/13/17  3:12 PM  Result Value Ref Range   Creatinine, Urine 276.00 mg/dL   Total Protein, Urine 185 mg/dL   Protein Creatinine Ratio 0.67 (H) 0.00 - 0.15 mg/mg[Cre]  CBC     Status: Abnormal   Collection Time: 07/13/17  4:18 PM  Result Value Ref Range   WBC 8.4 4.0 - 10.5 K/uL   RBC 3.69 (L) 3.87 - 5.11 MIL/uL   Hemoglobin 9.5 (L) 12.0 - 15.0 g/dL   HCT 29.6 (L) 36.0 - 46.0 %   MCV 80.2 78.0 - 100.0 fL   MCH 25.7 (L) 26.0 - 34.0 pg   MCHC 32.1 30.0 - 36.0 g/dL   RDW 14.4 11.5 - 15.5 %   Platelets 145 (L) 150 - 400 K/uL  Comprehensive metabolic panel     Status: Abnormal   Collection Time: 07/13/17  4:18 PM  Result Value Ref Range   Sodium 136 135 - 145 mmol/L   Potassium 4.6 3.5 - 5.1 mmol/L   Chloride 105 101 - 111 mmol/L   CO2 22 22 - 32 mmol/L   Glucose, Bld 102 (H) 65 - 99 mg/dL   BUN 12 6 - 20 mg/dL   Creatinine, Ser 0.71 0.44 - 1.00 mg/dL   Calcium 8.4 (L) 8.9 - 10.3 mg/dL   Total Protein 6.2 (L) 6.5 - 8.1 g/dL   Albumin 2.6 (L) 3.5 - 5.0 g/dL   AST 17 15 - 41 U/L   ALT 11 (L) 14 - 54 U/L    Alkaline Phosphatase 138 (H) 38 - 126 U/L   Total Bilirubin 0.4 0.3 - 1.2 mg/dL  GFR calc non Af Amer >60 >60 mL/min   GFR calc Af Amer >60 >60 mL/min   Anion gap 9 5 - 15    MDM Consult with Dr. Lynnette Caffey - will draw labs and evaluate blood pressure further Consult with Dr. Lynnette Caffey and reviewed labs - will discharge and be seen in the office on Monday. Serial blood pressures done - first ones were 137/93, 147/89, 146/84, 140/80, then under 140/90 and decreasing - the last one was 115/65.  Assessment and Plan  Mild preeclampsia at 34 weeks  Plan Be seen in the office on Monday. Rest frequently this weekend and keep track of the blood pressures as you have been. Return if you have headaches, visual changes or flashes of lights, RUQ pain, or increasing edema. Drink at least 8 8-oz glasses of water every day. Continue your medications as you have been doing.  Darnel Mchan L Myran Arcia 07/13/2017, 4:04 PM

## 2017-07-13 NOTE — Discharge Instructions (Signed)
Continue to check your blood pressure as you have been doing. Return for elevated BP, right upper quadrant pain , severe edema, headache or visual changes. Keep your appointment in the office on Monday.

## 2017-07-13 NOTE — MAU Note (Signed)
Pt reports b/p elevated at home 150/100. Also c/o of numbness in rught leg that started today. Reports dull/pressure headache in the front. Some blurry vision

## 2017-07-15 ENCOUNTER — Encounter (HOSPITAL_COMMUNITY): Payer: Self-pay | Admitting: *Deleted

## 2017-07-15 ENCOUNTER — Other Ambulatory Visit: Payer: Self-pay

## 2017-07-15 ENCOUNTER — Inpatient Hospital Stay (HOSPITAL_COMMUNITY)
Admission: AD | Admit: 2017-07-15 | Discharge: 2017-07-21 | DRG: 787 | Disposition: A | Discharge status: Home / Self Care | Payer: Managed Care, Other (non HMO) | Source: Ambulatory Visit | Attending: Obstetrics & Gynecology | Admitting: Obstetrics & Gynecology

## 2017-07-15 DIAGNOSIS — O1414 Severe pre-eclampsia complicating childbirth: Principal | ICD-10-CM | POA: Diagnosis present

## 2017-07-15 DIAGNOSIS — F419 Anxiety disorder, unspecified: Secondary | ICD-10-CM | POA: Diagnosis present

## 2017-07-15 DIAGNOSIS — F329 Major depressive disorder, single episode, unspecified: Secondary | ICD-10-CM | POA: Diagnosis present

## 2017-07-15 DIAGNOSIS — Z98891 History of uterine scar from previous surgery: Secondary | ICD-10-CM

## 2017-07-15 DIAGNOSIS — O9081 Anemia of the puerperium: Secondary | ICD-10-CM | POA: Diagnosis present

## 2017-07-15 DIAGNOSIS — O133 Gestational [pregnancy-induced] hypertension without significant proteinuria, third trimester: Secondary | ICD-10-CM

## 2017-07-15 DIAGNOSIS — O34211 Maternal care for low transverse scar from previous cesarean delivery: Secondary | ICD-10-CM | POA: Diagnosis present

## 2017-07-15 DIAGNOSIS — D225 Melanocytic nevi of trunk: Secondary | ICD-10-CM | POA: Diagnosis present

## 2017-07-15 DIAGNOSIS — O26893 Other specified pregnancy related conditions, third trimester: Secondary | ICD-10-CM | POA: Diagnosis present

## 2017-07-15 DIAGNOSIS — O99344 Other mental disorders complicating childbirth: Secondary | ICD-10-CM | POA: Diagnosis present

## 2017-07-15 DIAGNOSIS — O9912 Other diseases of the blood and blood-forming organs and certain disorders involving the immune mechanism complicating childbirth: Secondary | ICD-10-CM | POA: Diagnosis present

## 2017-07-15 DIAGNOSIS — D6851 Activated protein C resistance: Secondary | ICD-10-CM | POA: Diagnosis present

## 2017-07-15 DIAGNOSIS — O34219 Maternal care for unspecified type scar from previous cesarean delivery: Secondary | ICD-10-CM

## 2017-07-15 DIAGNOSIS — O99285 Endocrine, nutritional and metabolic diseases complicating the puerperium: Secondary | ICD-10-CM | POA: Diagnosis present

## 2017-07-15 DIAGNOSIS — R200 Anesthesia of skin: Secondary | ICD-10-CM

## 2017-07-15 DIAGNOSIS — O1490 Unspecified pre-eclampsia, unspecified trimester: Secondary | ICD-10-CM | POA: Diagnosis present

## 2017-07-15 DIAGNOSIS — R202 Paresthesia of skin: Secondary | ICD-10-CM

## 2017-07-15 DIAGNOSIS — Z3A34 34 weeks gestation of pregnancy: Secondary | ICD-10-CM

## 2017-07-15 DIAGNOSIS — Z6791 Unspecified blood type, Rh negative: Secondary | ICD-10-CM

## 2017-07-15 DIAGNOSIS — O1493 Unspecified pre-eclampsia, third trimester: Secondary | ICD-10-CM

## 2017-07-15 DIAGNOSIS — E871 Hypo-osmolality and hyponatremia: Secondary | ICD-10-CM | POA: Diagnosis present

## 2017-07-15 LAB — COMPREHENSIVE METABOLIC PANEL
ALBUMIN: 2.5 g/dL — AB (ref 3.5–5.0)
ALK PHOS: 149 U/L — AB (ref 38–126)
ALT: 10 U/L — ABNORMAL LOW (ref 14–54)
ANION GAP: 6 (ref 5–15)
AST: 19 U/L (ref 15–41)
BILIRUBIN TOTAL: 0.4 mg/dL (ref 0.3–1.2)
BUN: 12 mg/dL (ref 6–20)
CALCIUM: 8.4 mg/dL — AB (ref 8.9–10.3)
CO2: 22 mmol/L (ref 22–32)
Chloride: 106 mmol/L (ref 101–111)
Creatinine, Ser: 0.7 mg/dL (ref 0.44–1.00)
GFR calc Af Amer: >60 mL/min (ref >60)
GFR calc non Af Amer: >60 mL/min (ref >60)
GLUCOSE: 109 mg/dL — AB (ref 65–99)
Potassium: 4 mmol/L (ref 3.5–5.1)
Sodium: 134 mmol/L — ABNORMAL LOW (ref 135–145)
TOTAL PROTEIN: 6 g/dL — AB (ref 6.5–8.1)

## 2017-07-15 LAB — CBC
HEMATOCRIT: 28.1 % — AB (ref 36.0–46.0)
HEMATOCRIT: 27.8 % — AB (ref 36.0–46.0)
HEMOGLOBIN: 8.9 g/dL — AB (ref 12.0–15.0)
Hemoglobin: 9.1 g/dL — ABNORMAL LOW (ref 12.0–15.0)
MCH: 25.4 pg — AB (ref 26.0–34.0)
MCH: 26.1 pg (ref 26.0–34.0)
MCHC: 31.7 g/dL (ref 30.0–36.0)
MCHC: 32.7 g/dL (ref 30.0–36.0)
MCV: 80.1 fL (ref 78.0–100.0)
MCV: 79.9 fL (ref 78.0–100.0)
Platelets: 139 10*3/uL — ABNORMAL LOW (ref 150–400)
Platelets: 135 10*3/uL — ABNORMAL LOW (ref 150–400)
RBC: 3.51 MIL/uL — ABNORMAL LOW (ref 3.87–5.11)
RBC: 3.48 MIL/uL — ABNORMAL LOW (ref 3.87–5.11)
RDW: 14.5 % (ref 11.5–15.5)
RDW: 14.4 % (ref 11.5–15.5)
WBC: 8.8 10*3/uL (ref 4.0–10.5)
WBC: 9 10*3/uL (ref 4.0–10.5)

## 2017-07-15 LAB — URINALYSIS, ROUTINE W REFLEX MICROSCOPIC
Bilirubin Urine: NEGATIVE
GLUCOSE, UA: NEGATIVE mg/dL
Hgb urine dipstick: NEGATIVE
Ketones, ur: NEGATIVE mg/dL
Leukocytes, UA: NEGATIVE
NITRITE: NEGATIVE
PROTEIN: 100 mg/dL — AB
SPECIFIC GRAVITY, URINE: 1.013 (ref 1.005–1.030)
pH: 6 (ref 5.0–8.0)

## 2017-07-15 LAB — CREATININE, SERUM
Creatinine, Ser: 0.66 mg/dL (ref 0.44–1.00)
GFR calc Af Amer: >60 mL/min (ref >60)

## 2017-07-15 LAB — PROTEIN / CREATININE RATIO, URINE
Creatinine, Urine: 88 mg/dL
PROTEIN CREATININE RATIO: 1.69 mg/mg{creat} — AB (ref 0.00–0.15)
Total Protein, Urine: 149 mg/dL

## 2017-07-15 MED ORDER — DOCUSATE SODIUM 100 MG PO CAPS
100.0000 mg | ORAL_CAPSULE | Freq: Every day | ORAL | Status: DC
  Administered 2017-07-16 – 2017-07-21 (×6): 100 mg via ORAL
  Filled 2017-07-15 (×6): qty 1

## 2017-07-15 MED ORDER — MAGNESIUM SULFATE BOLUS VIA INFUSION
6.0000 g | Freq: Once | INTRAVENOUS | Status: AC
  Administered 2017-07-15: 6 g via INTRAVENOUS
  Filled 2017-07-15: qty 500

## 2017-07-15 MED ORDER — MAGNESIUM SULFATE 40 G IN LACTATED RINGERS - SIMPLE
2.0000 g/h | INTRAVENOUS | Status: DC
  Administered 2017-07-15 – 2017-07-16 (×2): 2 g/h via INTRAVENOUS
  Filled 2017-07-15 (×2): qty 40

## 2017-07-15 MED ORDER — ACETAMINOPHEN 325 MG PO TABS
650.0000 mg | ORAL_TABLET | ORAL | Status: DC | PRN
  Administered 2017-07-16 – 2017-07-19 (×7): 650 mg via ORAL
  Filled 2017-07-15 (×6): qty 2

## 2017-07-15 MED ORDER — ZOLPIDEM TARTRATE 5 MG PO TABS
5.0000 mg | ORAL_TABLET | Freq: Every evening | ORAL | Status: DC | PRN
  Administered 2017-07-16 – 2017-07-20 (×2): 5 mg via ORAL
  Filled 2017-07-15 (×2): qty 1

## 2017-07-15 MED ORDER — SODIUM CHLORIDE 0.9 % IV SOLN
INTRAVENOUS | Status: DC
  Administered 2017-07-15 – 2017-07-16 (×3): via INTRAVENOUS
  Administered 2017-07-16: 50 mL/h via INTRAVENOUS

## 2017-07-15 MED ORDER — PRENATAL MULTIVITAMIN CH
1.0000 | ORAL_TABLET | Freq: Every day | ORAL | Status: DC
  Administered 2017-07-16 – 2017-07-20 (×5): 1 via ORAL
  Filled 2017-07-15 (×5): qty 1

## 2017-07-15 MED ORDER — SERTRALINE HCL 100 MG PO TABS
200.0000 mg | ORAL_TABLET | Freq: Every day | ORAL | Status: DC
  Administered 2017-07-15: 200 mg via ORAL
  Filled 2017-07-15 (×2): qty 2

## 2017-07-15 MED ORDER — ASPIRIN 81 MG PO CHEW
81.0000 mg | CHEWABLE_TABLET | Freq: Every day | ORAL | Status: DC
  Administered 2017-07-15 – 2017-07-16 (×2): 81 mg via ORAL
  Filled 2017-07-15 (×3): qty 1

## 2017-07-15 MED ORDER — ENOXAPARIN SODIUM 40 MG/0.4ML ~~LOC~~ SOLN
40.0000 mg | SUBCUTANEOUS | Status: DC
Start: 2017-07-15 — End: 2017-07-16
  Administered 2017-07-15: 40 mg via SUBCUTANEOUS
  Filled 2017-07-15 (×2): qty 0.4

## 2017-07-15 MED ORDER — BETAMETHASONE SOD PHOS & ACET 6 (3-3) MG/ML IJ SUSP
12.0000 mg | INTRAMUSCULAR | Status: AC
  Administered 2017-07-15 – 2017-07-16 (×2): 12 mg via INTRAMUSCULAR
  Filled 2017-07-15 (×2): qty 2

## 2017-07-15 MED ORDER — CALCIUM CARBONATE ANTACID 500 MG PO CHEW: 2.0000 | CHEWABLE_TABLET | ORAL | Status: DC | PRN

## 2017-07-15 NOTE — MAU Provider Note (Signed)
History   [id number] @ 34.5 wks in with c/o BP's being elevated at home. Pt is prev c/s. Also c/o right sided numbness that comes and goes.  CSN: [id number]  Arrival date & time 07/15/17  1837   None     Chief Complaint  Patient presents with  . Hypertension    HPI  Past Medical History:  Diagnosis Date  . Anxiety   . Depression   . GERD (gastroesophageal reflux disease)   . Preeclampsia 2015  . Preterm delivery 2015   31 wks d/t preeclampsia    Past Surgical History:  Procedure Laterality Date  . CESAREAN SECTION N/A 04/28/2014   Procedure: CESAREAN SECTION;  Surgeon: Allena Katz, MD;  Location: [facility];  Service: Obstetrics;  Laterality: N/A;    No family history on file.  Social History   Tobacco Use  . Smoking status: Never Smoker  . Smokeless tobacco: Never Used  Substance Use Topics  . Alcohol use: Yes    Comment: None since pregnancy  . Drug use: No    OB History    Gravida Para Term Preterm AB Living   2 1   1   1    SAB TAB Ectopic Multiple Live Births           1      Review of Systems  Constitutional: Negative.   HENT: Negative.   Eyes: Negative.   Respiratory: Negative.   Cardiovascular: Negative.   Gastrointestinal: Negative.   Endocrine: Negative.   Genitourinary: Negative.   Musculoskeletal: Negative.   Skin: Negative.   Allergic/Immunologic: Negative.   Neurological: Positive for numbness.  Hematological: Negative.   Psychiatric/Behavioral: Negative.     Allergies  Sulfa antibiotics  Home Medications    BP (!) 153/90 (BP Location: Right Arm)   Pulse 79   Resp 19   Ht 5' 2.5" (1.588 m)   Wt 207 lb (93.9 kg)   SpO2 99%   BMI 37.26 kg/m   Physical Exam  Constitutional: She appears well-developed and well-nourished.  HENT:  Head: Normocephalic.  Eyes: Pupils are equal, round, and reactive to light.  Neck: Normal range of motion.  Cardiovascular: Normal rate, regular rhythm, normal heart sounds and intact distal  pulses.  Pulmonary/Chest: Effort normal and breath sounds normal.  Abdominal: Soft. Bowel sounds are normal.  Musculoskeletal: Normal range of motion.  DTR's 2+ To 3+ no clonus  Neurological:  No asemitry  Skin: Skin is warm and dry.  Psychiatric: She has a normal mood and affect. Her behavior is normal. Judgment and thought content normal.    MAU Course  Procedures (including critical care time)  Labs Reviewed  URINALYSIS, ROUTINE W REFLEX MICROSCOPIC - Abnormal; Notable for the following components:      Result Value   Protein, ur 100 (*)    Bacteria, UA RARE (*)    Squamous Epithelial / LPF 0-5 (*)    All other components within normal limits  PROTEIN / CREATININE RATIO, URINE  CBC  COMPREHENSIVE METABOLIC PANEL   No results found.   1. PIH (pregnancy induced hypertension), third trimester   2. Numbness and tingling of right upper and lower extremity       MDM  BP's continue elevated. POC discussed with Dr. Lynnette Caffey. She statesw she will come in and admit the pt.

## 2017-07-15 NOTE — H&P (Signed)
Diana Simpson is a 31 y.o. female presenting for evaluation of elevated BPs at home and right sided face, arm and leg numbness.  BPs in MAU 139/91-154/96.  P:C 1.69.  Pre-E labs wnl except PLT 139. The patient has been monitoring BPs at home because of h/o Pre-eclampsia requiring delivery by C/S at 31 weeks.  She also has noticed numbness on the right side off and on for several days; no loss of function or strength.  She denies HA currently but has mild HA off and on which resolves without medication.  No visual disturbance, no CP/SOB, no RUQ pain.  Active FM.  Significant antepartum concern is Factor V Leiden for which she has taken ppx LMWH.  She has anxiety and depression and takes sertraline.   OB History    Gravida Para Term Preterm AB Living   2 1   1   1    SAB TAB Ectopic Multiple Live Births           1     Past Medical History:  Diagnosis Date  . Anxiety   . Depression   . GERD (gastroesophageal reflux disease)   . Preeclampsia 2015  . Preterm delivery 2015   31 wks d/t preeclampsia   Past Surgical History:  Procedure Laterality Date  . CESAREAN SECTION N/A 04/28/2014   Procedure: CESAREAN SECTION;  Surgeon: Allena Katz, MD;  Location: [facility];  Service: Obstetrics;  Laterality: N/A;   Family History: family history is not on file. Social History:  reports that  has never smoked. she has never used smokeless tobacco. She reports that she drinks alcohol. She reports that she does not use drugs.     Maternal Diabetes: No Genetic Screening: Normal Maternal Ultrasounds/Referrals: Normal Fetal Ultrasounds or other Referrals:  None Maternal Substance Abuse:  No Significant Maternal Medications:  Meds include: Zoloft Other: LMWH Significant Maternal Lab Results:  Lab values include: Rh negative Other Comments:  None  ROS Maternal Medical History:  Fetal activity: Perceived fetal activity is normal.   Last perceived fetal movement was within the past hour.    Prenatal  complications: Pre-eclampsia.   Prenatal Complications - Diabetes: none.      Blood pressure 140/78, pulse 74, resp. rate 19, height 5' 2.5" (1.588 m), weight 207 lb (93.9 kg), SpO2 99 %, unknown if currently breastfeeding. Maternal Exam:  Abdomen: Patient reports no abdominal tenderness. Surgical scars: low transverse.   Fundal height is c/w dates.       Physical Exam  Constitutional: She is oriented to person, place, and time. She appears well-developed and well-nourished.  HENT:  Head: Normocephalic and atraumatic.    GI: Soft. There is no rebound and no guarding.  Musculoskeletal: Normal range of motion.       Right hip: Normal.       Right upper arm: Normal.       Right forearm: Normal.       Right upper leg: Normal.       Right lower leg: Normal.  Neurological: She is alert and oriented to person, place, and time. She displays abnormal reflex.  Reflex Scores:      Patellar reflexes are 3+ on the right side and 3+ on the left side. No clonus  Skin: Skin is warm and dry.  Psychiatric: She has a normal mood and affect. Her behavior is normal.    Prenatal labs: ABO, Rh:   Antibody:   Rubella:   RPR:  HBsAg:    HIV:    GBS:     Assessment/Plan: 31yo G2P0101 at [redacted]w[redacted]d with pre-eclampsia -BMZ -Mag for seizure ppx -Labs in AM -MFM u/s tomorrow -24 hour urine -FVL-ppx LMWH  Salahuddin Arismendez 07/15/2017, 8:44 PM

## 2017-07-15 NOTE — MAU Note (Addendum)
Pt reports high blood pressure readings at home ( 170/94, 149/95, 143/95). Pt states her right arm and right leg feel numb and tingling.

## 2017-07-15 NOTE — MAU Note (Signed)
Pt reports tingling on right side of the body a few days ago , then returned today. Denies headache. + FM

## 2017-07-16 ENCOUNTER — Inpatient Hospital Stay (HOSPITAL_COMMUNITY): Payer: Managed Care, Other (non HMO) | Admitting: Anesthesiology

## 2017-07-16 ENCOUNTER — Observation Stay (HOSPITAL_COMMUNITY): Payer: Managed Care, Other (non HMO)

## 2017-07-16 ENCOUNTER — Encounter (HOSPITAL_COMMUNITY): Payer: Self-pay | Admitting: Anesthesiology

## 2017-07-16 ENCOUNTER — Encounter (HOSPITAL_COMMUNITY)
Admission: AD | Disposition: A | Discharge status: Home / Self Care | Payer: Self-pay | Source: Ambulatory Visit | Attending: Obstetrics & Gynecology

## 2017-07-16 DIAGNOSIS — O26893 Other specified pregnancy related conditions, third trimester: Secondary | ICD-10-CM | POA: Diagnosis present

## 2017-07-16 DIAGNOSIS — D225 Melanocytic nevi of trunk: Secondary | ICD-10-CM | POA: Diagnosis present

## 2017-07-16 DIAGNOSIS — E871 Hypo-osmolality and hyponatremia: Secondary | ICD-10-CM | POA: Diagnosis present

## 2017-07-16 DIAGNOSIS — O9081 Anemia of the puerperium: Secondary | ICD-10-CM | POA: Diagnosis present

## 2017-07-16 DIAGNOSIS — Z6791 Unspecified blood type, Rh negative: Secondary | ICD-10-CM | POA: Diagnosis not present

## 2017-07-16 DIAGNOSIS — Z3A34 34 weeks gestation of pregnancy: Secondary | ICD-10-CM | POA: Diagnosis not present

## 2017-07-16 DIAGNOSIS — F329 Major depressive disorder, single episode, unspecified: Secondary | ICD-10-CM | POA: Diagnosis present

## 2017-07-16 DIAGNOSIS — O1414 Severe pre-eclampsia complicating childbirth: Secondary | ICD-10-CM | POA: Diagnosis present

## 2017-07-16 DIAGNOSIS — F419 Anxiety disorder, unspecified: Secondary | ICD-10-CM | POA: Diagnosis present

## 2017-07-16 DIAGNOSIS — R030 Elevated blood-pressure reading, without diagnosis of hypertension: Secondary | ICD-10-CM | POA: Diagnosis present

## 2017-07-16 DIAGNOSIS — O34211 Maternal care for low transverse scar from previous cesarean delivery: Secondary | ICD-10-CM | POA: Diagnosis present

## 2017-07-16 DIAGNOSIS — O9912 Other diseases of the blood and blood-forming organs and certain disorders involving the immune mechanism complicating childbirth: Secondary | ICD-10-CM | POA: Diagnosis present

## 2017-07-16 DIAGNOSIS — O99285 Endocrine, nutritional and metabolic diseases complicating the puerperium: Secondary | ICD-10-CM | POA: Diagnosis present

## 2017-07-16 DIAGNOSIS — O99344 Other mental disorders complicating childbirth: Secondary | ICD-10-CM | POA: Diagnosis present

## 2017-07-16 DIAGNOSIS — D6851 Activated protein C resistance: Secondary | ICD-10-CM | POA: Diagnosis present

## 2017-07-16 LAB — CBC
HEMATOCRIT: 27.6 % — AB (ref 36.0–46.0)
HEMATOCRIT: 27.4 % — AB (ref 36.0–46.0)
HEMOGLOBIN: 8.8 g/dL — AB (ref 12.0–15.0)
Hemoglobin: 8.9 g/dL — ABNORMAL LOW (ref 12.0–15.0)
MCH: 25.6 pg — ABNORMAL LOW (ref 26.0–34.0)
MCH: 25.7 pg — ABNORMAL LOW (ref 26.0–34.0)
MCHC: 32.2 g/dL (ref 30.0–36.0)
MCHC: 32.1 g/dL (ref 30.0–36.0)
MCV: 79.3 fL (ref 78.0–100.0)
MCV: 79.9 fL (ref 78.0–100.0)
PLATELETS: 132 10*3/uL — AB (ref 150–400)
Platelets: 158 10*3/uL (ref 150–400)
RBC: 3.48 MIL/uL — ABNORMAL LOW (ref 3.87–5.11)
RBC: 3.43 MIL/uL — ABNORMAL LOW (ref 3.87–5.11)
RDW: 14.5 % (ref 11.5–15.5)
RDW: 14.6 % (ref 11.5–15.5)
WBC: 9.6 10*3/uL (ref 4.0–10.5)
WBC: 10.9 10*3/uL — AB (ref 4.0–10.5)

## 2017-07-16 LAB — COMPREHENSIVE METABOLIC PANEL
ALBUMIN: 2.5 g/dL — AB (ref 3.5–5.0)
ALK PHOS: 153 U/L — AB (ref 38–126)
ALT: 10 U/L — ABNORMAL LOW (ref 14–54)
ALT: 11 U/L — ABNORMAL LOW (ref 14–54)
AST: 20 U/L (ref 15–41)
AST: 11 U/L — AB (ref 15–41)
Albumin: 2.4 g/dL — ABNORMAL LOW (ref 3.5–5.0)
Alkaline Phosphatase: 158 U/L — ABNORMAL HIGH (ref 38–126)
Anion gap: 8 (ref 5–15)
Anion gap: 8 (ref 5–15)
BILIRUBIN TOTAL: 0.7 mg/dL (ref 0.3–1.2)
BILIRUBIN TOTAL: 0.6 mg/dL (ref 0.3–1.2)
BUN: 13 mg/dL (ref 6–20)
BUN: 12 mg/dL (ref 6–20)
CALCIUM: 7.8 mg/dL — AB (ref 8.9–10.3)
CO2: 20 mmol/L — ABNORMAL LOW (ref 22–32)
CO2: 21 mmol/L — ABNORMAL LOW (ref 22–32)
Calcium: 7.1 mg/dL — ABNORMAL LOW (ref 8.9–10.3)
Chloride: 104 mmol/L (ref 101–111)
Chloride: 102 mmol/L (ref 101–111)
Creatinine, Ser: 0.65 mg/dL (ref 0.44–1.00)
Creatinine, Ser: 0.75 mg/dL (ref 0.44–1.00)
GFR calc Af Amer: >60 mL/min (ref >60)
GFR calc Af Amer: >60 mL/min (ref >60)
GFR calc non Af Amer: >60 mL/min (ref >60)
GLUCOSE: 114 mg/dL — AB (ref 65–99)
Glucose, Bld: 116 mg/dL — ABNORMAL HIGH (ref 65–99)
POTASSIUM: 4.9 mmol/L (ref 3.5–5.1)
POTASSIUM: 4.2 mmol/L (ref 3.5–5.1)
Sodium: 132 mmol/L — ABNORMAL LOW (ref 135–145)
Sodium: 131 mmol/L — ABNORMAL LOW (ref 135–145)
TOTAL PROTEIN: 5.8 g/dL — AB (ref 6.5–8.1)
TOTAL PROTEIN: 5.8 g/dL — AB (ref 6.5–8.1)

## 2017-07-16 LAB — PREPARE RBC (CROSSMATCH)

## 2017-07-16 SURGERY — Surgical Case
Anesthesia: *Unknown

## 2017-07-16 SURGERY — Surgical Case
Anesthesia: Epidural

## 2017-07-16 MED ORDER — FENTANYL CITRATE (PF) 100 MCG/2ML IJ SOLN
INTRAMUSCULAR | Status: AC
  Filled 2017-07-16: qty 2

## 2017-07-16 MED ORDER — BUPIVACAINE IN DEXTROSE 0.75-8.25 % IT SOLN
INTRATHECAL | Status: DC | PRN
  Administered 2017-07-16: 11 mg via INTRATHECAL

## 2017-07-16 MED ORDER — DEXAMETHASONE SODIUM PHOSPHATE 10 MG/ML IJ SOLN
INTRAMUSCULAR | Status: DC | PRN
  Administered 2017-07-16: 10 mg via INTRAVENOUS

## 2017-07-16 MED ORDER — SODIUM CHLORIDE 0.9 % IV SOLN: INTRAVENOUS | Status: DC

## 2017-07-16 MED ORDER — ONDANSETRON HCL 4 MG/2ML IJ SOLN
INTRAMUSCULAR | Status: AC
  Filled 2017-07-16: qty 2

## 2017-07-16 MED ORDER — SODIUM CHLORIDE 0.9 % IR SOLN
Status: DC | PRN
  Administered 2017-07-16: 1000 mL

## 2017-07-16 MED ORDER — SCOPOLAMINE 1 MG/3DAYS TD PT72
MEDICATED_PATCH | TRANSDERMAL | Status: DC | PRN
  Administered 2017-07-16: 1 via TRANSDERMAL

## 2017-07-16 MED ORDER — OXYTOCIN 10 UNIT/ML IJ SOLN
INTRAMUSCULAR | Status: AC
  Filled 2017-07-16: qty 4

## 2017-07-16 MED ORDER — MORPHINE SULFATE (PF) 0.5 MG/ML IJ SOLN
INTRAMUSCULAR | Status: AC
  Filled 2017-07-16: qty 10

## 2017-07-16 MED ORDER — LACTATED RINGERS IV SOLN
INTRAVENOUS | Status: DC | PRN
  Administered 2017-07-16: 40 [IU] via INTRAVENOUS

## 2017-07-16 MED ORDER — DEXAMETHASONE SODIUM PHOSPHATE 10 MG/ML IJ SOLN
INTRAMUSCULAR | Status: AC
  Filled 2017-07-16: qty 1

## 2017-07-16 MED ORDER — ONDANSETRON HCL 4 MG/2ML IJ SOLN
INTRAMUSCULAR | Status: DC | PRN
  Administered 2017-07-16: 4 mg via INTRAVENOUS

## 2017-07-16 MED ORDER — PHENYLEPHRINE 8 MG IN D5W 100 ML (0.08MG/ML) PREMIX OPTIME
INJECTION | INTRAVENOUS | Status: AC
  Filled 2017-07-16: qty 100

## 2017-07-16 MED ORDER — CEFAZOLIN SODIUM-DEXTROSE 2-3 GM-%(50ML) IV SOLR
INTRAVENOUS | Status: AC
Start: 2017-07-16 — End: 2017-07-16
  Filled 2017-07-16: qty 50

## 2017-07-16 MED ORDER — LACTATED RINGERS IV SOLN
INTRAVENOUS | Status: DC | PRN
  Administered 2017-07-16: 22:00:00 via INTRAVENOUS

## 2017-07-16 MED ORDER — PHENYLEPHRINE 8 MG IN D5W 100 ML (0.08MG/ML) PREMIX OPTIME
INJECTION | INTRAVENOUS | Status: DC | PRN
  Administered 2017-07-16: 60 ug/min via INTRAVENOUS

## 2017-07-16 MED ORDER — SOD CITRATE-CITRIC ACID 500-334 MG/5ML PO SOLN
30.0000 mL | ORAL | Status: AC
  Administered 2017-07-16: 30 mL via ORAL
  Filled 2017-07-16: qty 15

## 2017-07-16 MED ORDER — SOD CITRATE-CITRIC ACID 500-334 MG/5ML PO SOLN: 30.0000 mL | ORAL | Status: DC

## 2017-07-16 MED ORDER — MAGNESIUM SULFATE 40 G IN LACTATED RINGERS - SIMPLE
2.0000 g/h | INTRAVENOUS | Status: DC
  Administered 2017-07-16: 2 g/h via INTRAVENOUS
  Filled 2017-07-16: qty 40

## 2017-07-16 MED ORDER — FENTANYL CITRATE (PF) 100 MCG/2ML IJ SOLN
INTRAMUSCULAR | Status: DC | PRN
  Administered 2017-07-16: 20 ug via INTRATHECAL

## 2017-07-16 MED ORDER — SCOPOLAMINE 1 MG/3DAYS TD PT72
MEDICATED_PATCH | TRANSDERMAL | Status: AC
  Filled 2017-07-16: qty 1

## 2017-07-16 MED ORDER — MAGNESIUM SULFATE BOLUS VIA INFUSION
4.0000 g | Freq: Once | INTRAVENOUS | Status: AC
  Administered 2017-07-16 (×2): 4 g via INTRAVENOUS
  Filled 2017-07-16: qty 500

## 2017-07-16 MED ORDER — CEFAZOLIN SODIUM-DEXTROSE 2-4 GM/100ML-% IV SOLN
2.0000 g | Freq: Once | INTRAVENOUS | Status: AC
  Administered 2017-07-16: 2 g via INTRAVENOUS
  Filled 2017-07-16: qty 100

## 2017-07-16 MED ORDER — LACTATED RINGERS IV SOLN
INTRAVENOUS | Status: DC | PRN
  Administered 2017-07-16: 23:00:00 via INTRAVENOUS

## 2017-07-16 MED ORDER — PANTOPRAZOLE SODIUM 40 MG PO TBEC
40.0000 mg | DELAYED_RELEASE_TABLET | Freq: Every day | ORAL | Status: DC
  Administered 2017-07-16: 40 mg via ORAL
  Filled 2017-07-16: qty 1

## 2017-07-16 MED ORDER — SODIUM CHLORIDE 0.9 % IV SOLN: Freq: Once | INTRAVENOUS | Status: DC

## 2017-07-16 MED ORDER — MORPHINE SULFATE (PF) 0.5 MG/ML IJ SOLN
INTRAMUSCULAR | Status: DC | PRN
  Administered 2017-07-16: .2 mg via INTRATHECAL

## 2017-07-16 SURGICAL SUPPLY — 41 items
BENZOIN TINCTURE PRP APPL 2/3 (GAUZE/BANDAGES/DRESSINGS) ×3 IMPLANT
CHLORAPREP W/TINT 26ML (MISCELLANEOUS) ×3 IMPLANT
CLAMP CORD UMBIL (MISCELLANEOUS) ×3 IMPLANT
CLOSURE STERI-STRIP 1/2X4 (GAUZE/BANDAGES/DRESSINGS) ×1
CLOSURE WOUND 1/2 X4 (GAUZE/BANDAGES/DRESSINGS)
CLOTH BEACON ORANGE TIMEOUT ST (SAFETY) ×3 IMPLANT
CLSR STERI-STRIP ANTIMIC 1/2X4 (GAUZE/BANDAGES/DRESSINGS) ×2 IMPLANT
CONTAINER PREFILL 10% NBF 15ML (MISCELLANEOUS) IMPLANT
DERMABOND ADVANCED (GAUZE/BANDAGES/DRESSINGS)
DERMABOND ADVANCED .7 DNX12 (GAUZE/BANDAGES/DRESSINGS) IMPLANT
DRAPE C SECTION CLR SCREEN (DRAPES) ×3 IMPLANT
DRSG OPSITE POSTOP 4X10 (GAUZE/BANDAGES/DRESSINGS) ×3 IMPLANT
ELECT REM PT RETURN 9FT ADLT (ELECTROSURGICAL) ×3
ELECTRODE REM PT RTRN 9FT ADLT (ELECTROSURGICAL) ×1 IMPLANT
EXTRACTOR VACUUM M CUP 4 TUBE (SUCTIONS) IMPLANT
EXTRACTOR VACUUM M CUP 4' TUBE (SUCTIONS)
GAUZE SPONGE 4X4 12PLY STRL LF (GAUZE/BANDAGES/DRESSINGS) ×6 IMPLANT
GLOVE BIO SURGEON STRL SZ7.5 (GLOVE) ×3 IMPLANT
GLOVE BIOGEL PI IND STRL 7.0 (GLOVE) ×1 IMPLANT
GLOVE BIOGEL PI INDICATOR 7.0 (GLOVE) ×2
GOWN STRL REUS W/TWL LRG LVL3 (GOWN DISPOSABLE) ×6 IMPLANT
KIT ABG SYR 3ML LUER SLIP (SYRINGE) ×3 IMPLANT
NEEDLE HYPO 25X5/8 SAFETYGLIDE (NEEDLE) ×3 IMPLANT
NS IRRIG 1000ML POUR BTL (IV SOLUTION) ×3 IMPLANT
PACK C SECTION WH (CUSTOM PROCEDURE TRAY) ×3 IMPLANT
PAD ABD 7.5X8 STRL (GAUZE/BANDAGES/DRESSINGS) ×3 IMPLANT
PAD OB MATERNITY 4.3X12.25 (PERSONAL CARE ITEMS) ×3 IMPLANT
PENCIL SMOKE EVAC W/HOLSTER (ELECTROSURGICAL) ×3 IMPLANT
SPONGE GAUZE 4X4 12PLY STER LF (GAUZE/BANDAGES/DRESSINGS) ×3 IMPLANT
STRIP CLOSURE SKIN 1/2X4 (GAUZE/BANDAGES/DRESSINGS) IMPLANT
SUT CHROMIC 2 0 SH (SUTURE) ×3 IMPLANT
SUT MNCRL 0 VIOLET CTX 36 (SUTURE) ×4 IMPLANT
SUT MONOCRYL 0 CTX 36 (SUTURE) ×8
SUT PDS AB 0 CTX 60 (SUTURE) ×3 IMPLANT
SUT PLAIN 0 NONE (SUTURE) IMPLANT
SUT PLAIN 2 0 (SUTURE)
SUT PLAIN 2 0 XLH (SUTURE) ×3 IMPLANT
SUT PLAIN ABS 2-0 CT1 27XMFL (SUTURE) IMPLANT
SUT VIC AB 4-0 KS 27 (SUTURE) ×3 IMPLANT
TOWEL OR 17X24 6PK STRL BLUE (TOWEL DISPOSABLE) ×3 IMPLANT
TRAY FOLEY BAG SILVER LF 14FR (SET/KITS/TRAYS/PACK) ×3 IMPLANT

## 2017-07-16 NOTE — Progress Notes (Signed)
Patient transferred to OR 9 via bed. Report given to Ina Kick, RN.

## 2017-07-16 NOTE — Progress Notes (Signed)
34 6/7 weeks  For past week C/O intermittent tingling of right arm and right leg below the knee. Yesterday was more pronounced. Today just a little residual tingling of right foot. At no time had numbness, weakness-no dropping or stumbling, no trouble speaking/hearing/vision. Denies epigastric pain.  Has had some vague pressure behind right forehead on/off.  Vitals:   07/16/17 0600 07/16/17 0712  BP: (!) 102/52 (!) 146/80  Pulse: 79 85  Resp: 16 16  Temp:  97.9 F (36.6 C)  SpO2: 98%    Magnesium sulfate running @ 2gm/hr Lungs CTA Cor RRR Abdomen no epigastric tenderness Face symmetric, pupils equal, grip strength 3+ bilaterally  DTR 2+ bilaterally  Results for orders placed or performed during the hospital encounter of 07/15/17 (from the past 48 hour(s))  Protein / creatinine ratio, urine     Status: Abnormal   Collection Time: 07/15/17  6:56 PM  Result Value Ref Range   Creatinine, Urine 88.00 mg/dL   Total Protein, Urine 149 mg/dL    Comment: RESULTS CONFIRMED BY MANUAL DILUTION NO NORMAL RANGE ESTABLISHED FOR THIS TEST    Protein Creatinine Ratio 1.69 (H) 0.00 - 0.15 mg/mg[Cre]  Urinalysis, Routine w reflex microscopic     Status: Abnormal   Collection Time: 07/15/17  6:56 PM  Result Value Ref Range   Color, Urine YELLOW YELLOW   APPearance CLEAR CLEAR   Specific Gravity, Urine 1.013 1.005 - 1.030   pH 6.0 5.0 - 8.0   Glucose, UA NEGATIVE NEGATIVE mg/dL   Hgb urine dipstick NEGATIVE NEGATIVE   Bilirubin Urine NEGATIVE NEGATIVE   Ketones, ur NEGATIVE NEGATIVE mg/dL   Protein, ur 100 (A) NEGATIVE mg/dL   Nitrite NEGATIVE NEGATIVE   Leukocytes, UA NEGATIVE NEGATIVE   RBC / HPF 0-5 0 - 5 RBC/hpf   WBC, UA 0-5 0 - 5 WBC/hpf   Bacteria, UA RARE (A) NONE SEEN   Squamous Epithelial / LPF 0-5 (A) NONE SEEN   Mucus PRESENT   CBC     Status: Abnormal   Collection Time: 07/15/17  7:13 PM  Result Value Ref Range   WBC 8.8 4.0 - 10.5 K/uL   RBC 3.51 (L) 3.87 - 5.11 MIL/uL    Hemoglobin 8.9 (L) 12.0 - 15.0 g/dL   HCT 28.1 (L) 36.0 - 46.0 %   MCV 80.1 78.0 - 100.0 fL   MCH 25.4 (L) 26.0 - 34.0 pg   MCHC 31.7 30.0 - 36.0 g/dL   RDW 14.5 11.5 - 15.5 %   Platelets 139 (L) 150 - 400 K/uL  Comprehensive metabolic panel     Status: Abnormal   Collection Time: 07/15/17  7:13 PM  Result Value Ref Range   Sodium 134 (L) 135 - 145 mmol/L   Potassium 4.0 3.5 - 5.1 mmol/L   Chloride 106 101 - 111 mmol/L   CO2 22 22 - 32 mmol/L   Glucose, Bld 109 (H) 65 - 99 mg/dL   BUN 12 6 - 20 mg/dL   Creatinine, Ser 0.70 0.44 - 1.00 mg/dL   Calcium 8.4 (L) 8.9 - 10.3 mg/dL   Total Protein 6.0 (L) 6.5 - 8.1 g/dL   Albumin 2.5 (L) 3.5 - 5.0 g/dL   AST 19 15 - 41 U/L   ALT 10 (L) 14 - 54 U/L   Alkaline Phosphatase 149 (H) 38 - 126 U/L   Total Bilirubin 0.4 0.3 - 1.2 mg/dL   GFR calc non Af Amer >60 >60 mL/min   GFR  calc Af Amer >60 >60 mL/min    Comment: (NOTE) The eGFR has been calculated using the CKD EPI equation. This calculation has not been validated in all clinical situations. eGFR's persistently <60 mL/min signify possible Chronic Kidney Disease.    Anion gap 6 5 - 15  Type and screen [place]     Status: None   Collection Time: 07/15/17  8:51 PM  Result Value Ref Range   ABO/RH(D) A NEG    Antibody Screen NEG    Sample Expiration 07/18/2017   CBC     Status: Abnormal   Collection Time: 07/15/17  8:51 PM  Result Value Ref Range   WBC 9.0 4.0 - 10.5 K/uL   RBC 3.48 (L) 3.87 - 5.11 MIL/uL   Hemoglobin 9.1 (L) 12.0 - 15.0 g/dL   HCT 27.8 (L) 36.0 - 46.0 %   MCV 79.9 78.0 - 100.0 fL   MCH 26.1 26.0 - 34.0 pg   MCHC 32.7 30.0 - 36.0 g/dL   RDW 14.4 11.5 - 15.5 %   Platelets 135 (L) 150 - 400 K/uL  Creatinine, serum     Status: None   Collection Time: 07/15/17  8:51 PM  Result Value Ref Range   Creatinine, Ser 0.66 0.44 - 1.00 mg/dL   GFR calc non Af Amer >60 >60 mL/min   GFR calc Af Amer >60 >60 mL/min    Comment: (NOTE) The eGFR  has been calculated using the CKD EPI equation. This calculation has not been validated in all clinical situations. eGFR's persistently <60 mL/min signify possible Chronic Kidney Disease.   Comprehensive metabolic panel     Status: Abnormal   Collection Time: 07/16/17  5:16 AM  Result Value Ref Range   Sodium 132 (L) 135 - 145 mmol/L   Potassium 4.9 3.5 - 5.1 mmol/L    Comment: DELTA CHECK NOTED REPEATED TO VERIFY NO VISIBLE HEMOLYSIS    Chloride 104 101 - 111 mmol/L   CO2 20 (L) 22 - 32 mmol/L   Glucose, Bld 116 (H) 65 - 99 mg/dL   BUN 13 6 - 20 mg/dL   Creatinine, Ser 0.65 0.44 - 1.00 mg/dL   Calcium 7.8 (L) 8.9 - 10.3 mg/dL   Total Protein 5.8 (L) 6.5 - 8.1 g/dL   Albumin 2.4 (L) 3.5 - 5.0 g/dL   AST 20 15 - 41 U/L   ALT 10 (L) 14 - 54 U/L   Alkaline Phosphatase 153 (H) 38 - 126 U/L   Total Bilirubin 0.7 0.3 - 1.2 mg/dL   GFR calc non Af Amer >60 >60 mL/min   GFR calc Af Amer >60 >60 mL/min    Comment: (NOTE) The eGFR has been calculated using the CKD EPI equation. This calculation has not been validated in all clinical situations. eGFR's persistently <60 mL/min signify possible Chronic Kidney Disease.    Anion gap 8 5 - 15  CBC     Status: Abnormal   Collection Time: 07/16/17  5:16 AM  Result Value Ref Range   WBC 9.6 4.0 - 10.5 K/uL   RBC 3.48 (L) 3.87 - 5.11 MIL/uL   Hemoglobin 8.9 (L) 12.0 - 15.0 g/dL   HCT 27.6 (L) 36.0 - 46.0 %   MCV 79.3 78.0 - 100.0 fL   MCH 25.6 (L) 26.0 - 34.0 pg   MCHC 32.2 30.0 - 36.0 g/dL   RDW 14.5 11.5 - 15.5 %   Platelets 132 (L) 150 - 400 K/uL  FHT cat one UCs irregular/mild  BMZ #1 done  A/P: Preeclampsia         Continue magnesium sulfate for now         MFM consult/U/S today         D/W patient above         24 hour urine in progress         BMZ #2 per schedule

## 2017-07-16 NOTE — Progress Notes (Signed)
Dr. Gaetano Net in talking with patient and her husband. Discussing plan of care and options. Plan to do C/section tonight when approved my Dr. Ambrose Pancoast.anesthesiology.

## 2017-07-16 NOTE — Consult Note (Signed)
MFM Note  Diana Simpson is a 31 year old [id number] Caucasian female at 34+[redacted] weeks gestation who was admitted yesterday for elevated BPs and some mild neurologic symptoms. Her prenatal course had been uneventful until about 2 weeks ago when her blood pressures began to increase. She was seen in the MAU on 11/15, 11/23 and 11/25 for elevated BPs either in the office or at home. All BPs have been less than 160/110. She began experiencing "tingling" in her right face, arm and leg about 3 days ago and she reports that the intensity waxes and wanes. Currently, she barely feels any numbness. She denies headache, abdominal pain and shortness of breath. Reports excellent fetal movement.  OB history: G1 - LTCS at 31 weeks for preeclampsia with severe features; 3+1 (growth restricted); G2 - [place] [facility] course: Magnesium sulfate given; course of BMZ initiated (#2 to be given tonight); BPs 140s150s/80s; 24 hour urine collection begun  [place]: singleton; cephalic presentation; limited anatomy, no gross abnormalities; high normal AFV vs mild polyhydramnios; EFW at the 51st %tile; 5+5  Labs: LFTs not elevated; Cr 0.65; H/H ~9/28; plts 130s (at booking 199K); 24 hour urine in process  Assessment:  1) SIUP at 34+6 weeks 2) PIH/PreE without severe features 3) H/O PreE with severe features at 31 weeks, first pregnancy 4) H/O cesarean section 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  (Face-to-face consultation with patient: 45 min)

## 2017-07-16 NOTE — Discharge Summary (Signed)
Physician Discharge Summary  Patient ID: Diana Simpson MRN: [id number] DOB/AGE: 24-Sep-1985 31 y.o.  Admit date: 07/15/2017 Discharge date: 07/16/2017  Admission Diagnoses:preeclampsia without severe features  Discharge Diagnoses:  Active Problems:   Pre-eclampsia    Without severe features  Discharged Condition: stable  Hospital Course: admitted for observation. She received magnesium sulfate and betemethasone x 2. FHT category 1. MFM consultation agrees with outpatient management. Will finish 24 hour urine collection at home.  Consults: MFM  Significant Diagnostic Studies: labs:  Results for orders placed or performed during the hospital encounter of 07/15/17 (from the past 24 hour(s))  Protein / creatinine ratio, urine     Status: Abnormal   Collection Time: 07/15/17  6:56 PM  Result Value Ref Range   Creatinine, Urine 88.00 mg/dL   Total Protein, Urine 149 mg/dL   Protein Creatinine Ratio 1.69 (H) 0.00 - 0.15 mg/mg[Cre]  Urinalysis, Routine w reflex microscopic     Status: Abnormal   Collection Time: 07/15/17  6:56 PM  Result Value Ref Range   Color, Urine YELLOW YELLOW   APPearance CLEAR CLEAR   Specific Gravity, Urine 1.013 1.005 - 1.030   pH 6.0 5.0 - 8.0   Glucose, UA NEGATIVE NEGATIVE mg/dL   Hgb urine dipstick NEGATIVE NEGATIVE   Bilirubin Urine NEGATIVE NEGATIVE   Ketones, ur NEGATIVE NEGATIVE mg/dL   Protein, ur 100 (A) NEGATIVE mg/dL   Nitrite NEGATIVE NEGATIVE   Leukocytes, UA NEGATIVE NEGATIVE   RBC / HPF 0-5 0 - 5 RBC/hpf   WBC, UA 0-5 0 - 5 WBC/hpf   Bacteria, UA RARE (A) NONE SEEN   Squamous Epithelial / LPF 0-5 (A) NONE SEEN   Mucus PRESENT   CBC     Status: Abnormal   Collection Time: 07/15/17  7:13 PM  Result Value Ref Range   WBC 8.8 4.0 - 10.5 K/uL   RBC 3.51 (L) 3.87 - 5.11 MIL/uL   Hemoglobin 8.9 (L) 12.0 - 15.0 g/dL   HCT 28.1 (L) 36.0 - 46.0 %   MCV 80.1 78.0 - 100.0 fL   MCH 25.4 (L) 26.0 - 34.0 pg   MCHC 31.7 30.0 - 36.0 g/dL   RDW  14.5 11.5 - 15.5 %   Platelets 139 (L) 150 - 400 K/uL  Comprehensive metabolic panel     Status: Abnormal   Collection Time: 07/15/17  7:13 PM  Result Value Ref Range   Sodium 134 (L) 135 - 145 mmol/L   Potassium 4.0 3.5 - 5.1 mmol/L   Chloride 106 101 - 111 mmol/L   CO2 22 22 - 32 mmol/L   Glucose, Bld 109 (H) 65 - 99 mg/dL   BUN 12 6 - 20 mg/dL   Creatinine, Ser 0.70 0.44 - 1.00 mg/dL   Calcium 8.4 (L) 8.9 - 10.3 mg/dL   Total Protein 6.0 (L) 6.5 - 8.1 g/dL   Albumin 2.5 (L) 3.5 - 5.0 g/dL   AST 19 15 - 41 U/L   ALT 10 (L) 14 - 54 U/L   Alkaline Phosphatase 149 (H) 38 - 126 U/L   Total Bilirubin 0.4 0.3 - 1.2 mg/dL   GFR calc non Af Amer >60 >60 mL/min   GFR calc Af Amer >60 >60 mL/min   Anion gap 6 5 - 15  Type and screen [place]     Status: None   Collection Time: 07/15/17  8:51 PM  Result Value Ref Range   ABO/RH(D) A NEG  Antibody Screen NEG    Sample Expiration 07/18/2017   CBC     Status: Abnormal   Collection Time: 07/15/17  8:51 PM  Result Value Ref Range   WBC 9.0 4.0 - 10.5 K/uL   RBC 3.48 (L) 3.87 - 5.11 MIL/uL   Hemoglobin 9.1 (L) 12.0 - 15.0 g/dL   HCT 27.8 (L) 36.0 - 46.0 %   MCV 79.9 78.0 - 100.0 fL   MCH 26.1 26.0 - 34.0 pg   MCHC 32.7 30.0 - 36.0 g/dL   RDW 14.4 11.5 - 15.5 %   Platelets 135 (L) 150 - 400 K/uL  Creatinine, serum     Status: None   Collection Time: 07/15/17  8:51 PM  Result Value Ref Range   Creatinine, Ser 0.66 0.44 - 1.00 mg/dL   GFR calc non Af Amer >60 >60 mL/min   GFR calc Af Amer >60 >60 mL/min  Comprehensive metabolic panel     Status: Abnormal   Collection Time: 07/16/17  5:16 AM  Result Value Ref Range   Sodium 132 (L) 135 - 145 mmol/L   Potassium 4.9 3.5 - 5.1 mmol/L   Chloride 104 101 - 111 mmol/L   CO2 20 (L) 22 - 32 mmol/L   Glucose, Bld 116 (H) 65 - 99 mg/dL   BUN 13 6 - 20 mg/dL   Creatinine, Ser 0.65 0.44 - 1.00 mg/dL   Calcium 7.8 (L) 8.9 - 10.3 mg/dL   Total Protein 5.8 (L) 6.5 - 8.1  g/dL   Albumin 2.4 (L) 3.5 - 5.0 g/dL   AST 20 15 - 41 U/L   ALT 10 (L) 14 - 54 U/L   Alkaline Phosphatase 153 (H) 38 - 126 U/L   Total Bilirubin 0.7 0.3 - 1.2 mg/dL   GFR calc non Af Amer >60 >60 mL/min   GFR calc Af Amer >60 >60 mL/min   Anion gap 8 5 - 15  CBC     Status: Abnormal   Collection Time: 07/16/17  5:16 AM  Result Value Ref Range   WBC 9.6 4.0 - 10.5 K/uL   RBC 3.48 (L) 3.87 - 5.11 MIL/uL   Hemoglobin 8.9 (L) 12.0 - 15.0 g/dL   HCT 27.6 (L) 36.0 - 46.0 %   MCV 79.3 78.0 - 100.0 fL   MCH 25.6 (L) 26.0 - 34.0 pg   MCHC 32.2 30.0 - 36.0 g/dL   RDW 14.5 11.5 - 15.5 %   Platelets 132 (L) 150 - 400 K/uL    Treatments: magnesium sulfate, betamethasone x 2  Discharge Exam: Blood pressure (!) 141/79, pulse 94, temperature 98 F (36.7 C), temperature source Oral, resp. rate 16, height 5' 2.5" (1.588 m), weight 207 lb (93.9 kg), SpO2 96 %, unknown if currently breastfeeding. General appearance: alert, cooperative and no distress Resp: clear to auscultation bilaterally Cardio: regular rate and rhythm  Disposition: 01-Home or Self Care  Discharge Instructions    Discharge patient   Complete by:  As directed    Discharge after second dose of betamethasone   Discharge disposition:  01-Home or Self Care   Discharge patient date:  07/16/2017     Allergies as of 07/16/2017      Reactions   Sulfa Antibiotics Other (See Comments)   Childhood reaction      Medication List    STOP taking these medications   calcium carbonate 500 MG chewable tablet Commonly known as:  TUMS - dosed in mg elemental calcium  esomeprazole 20 MG capsule Commonly known as:  NEXIUM     TAKE these medications   acetaminophen 500 MG tablet Commonly known as:  TYLENOL Take 500 mg by mouth every 6 (six) hours as needed for mild pain or headache.   aspirin 81 MG tablet Take 81 mg by mouth daily.   enoxaparin 40 MG/0.4ML injection Commonly known as:  LOVENOX Inject 40 mg at bedtime into  the skin.   prenatal multivitamin Tabs tablet Take 1 tablet by mouth daily.   sertraline 100 MG tablet Commonly known as:  ZOLOFT Take 1 tablet (100 mg total) by mouth daily. What changed:    how much to take  when to take this        Signed: Shon Millet II 07/16/2017, 6:14 PM

## 2017-07-16 NOTE — Anesthesia Procedure Notes (Signed)
Spinal  Patient location during procedure: OR Start time: 07/16/2017 9:38 PM End time: 07/16/2017 9:47 PM Staffing Anesthesiologist: Janeece Riggers, MD Preanesthetic Checklist Completed: patient identified, site marked, surgical consent, pre-op evaluation, timeout performed, IV checked, risks and benefits discussed and monitors and equipment checked Spinal Block Patient position: sitting Prep: DuraPrep Patient monitoring: heart rate, cardiac monitor, continuous pulse ox and blood pressure Approach: midline Location: L3-4 Injection technique: single-shot Needle Needle type: Sprotte  Needle gauge: 24 G Needle length: 9 cm Assessment Sensory level: T4

## 2017-07-16 NOTE — Anesthesia Postprocedure Evaluation (Signed)
Anesthesia Post Note  Patient: Estephanie Hubbs  Procedure(s) Performed: CESAREAN SECTION (N/A )     Patient location during evaluation: PACU Anesthesia Type: Spinal Level of consciousness: oriented and awake and alert Pain management: pain level controlled Vital Signs Assessment: post-procedure vital signs reviewed and stable Respiratory status: spontaneous breathing, respiratory function stable and patient connected to nasal cannula oxygen Cardiovascular status: blood pressure returned to baseline and stable Postop Assessment: no headache, no backache and no apparent nausea or vomiting Anesthetic complications: no    Last Vitals:  Vitals:   07/16/17 2330 07/16/17 2345  BP: 130/74 125/69  Pulse: 88 76  Resp: 19 16  Temp:    SpO2: 100% 99%    Last Pain:  Vitals:   07/16/17 2345  TempSrc:   PainSc: 0-No pain   Pain Goal: Patients Stated Pain Goal: 3 (07/16/17 2034)               Shaela Boer

## 2017-07-16 NOTE — Progress Notes (Signed)
After magnesium discontinued patient developed a HA. She was given Tylenol and took a nap. She awoke with a worse HA that made her cry. HA continues. No blurry vision or epigastric pain.  Vitals:   07/16/17 1143 07/16/17 1548  BP: 136/67 (!) 141/79  Pulse: 93 94  Resp: 16 16  Temp: 98.1 F (36.7 C) 98 F (36.7 C)  SpO2: 97% 96%   Lungs CTA Cor RRR Abdomen no epigastric tenderness  Repeat labs ordered  A/P: preeclampsia with severe features         Recommend delivery. D/W patient and husband C/S and risks including infection, organ damage, bleeding/transfusion-HIV/Hep, DVT/PE, pneumonia. Patient states she understands and agrees.         Restart magnesium sulfate

## 2017-07-16 NOTE — Brief Op Note (Signed)
07/15/2017 - 07/16/2017  10:58 PM  PATIENT:  Diana Simpson  31 y.o. female  PRE-OPERATIVE DIAGNOSIS:  repeat cesarean section due to severe preecclampsia,   POST-OPERATIVE DIAGNOSIS:  repeat cesarean section due to severe preecclampsia  PROCEDURE:  Procedure(s): CESAREAN SECTION (N/A) Removal nevus left groin SURGEON:  Surgeon(s) and Role:    Everlene Farrier, MD - Primary  PHYSICIAN ASSISTANT:   ASSISTANTS: none   ANESTHESIA:   spinal  EBL:  1440 mL   BLOOD ADMINISTERED:none  DRAINS: Urinary Catheter (Foley)   LOCAL MEDICATIONS USED:  NONE  SPECIMEN:  Source of Specimen:  placenta, nevus left groin  DISPOSITION OF SPECIMEN:  PATHOLOGY  COUNTS:  YES  TOURNIQUET:  * No tourniquets in log *  DICTATION: .Other Dictation: Dictation Number [id number]  PLAN OF CARE: Admit to inpatient   PATIENT DISPOSITION:  PACU - hemodynamically stable.   Delay start of Pharmacological VTE agent (>24hrs) due to surgical blood loss or risk of bleeding: not applicable

## 2017-07-16 NOTE — Plan of Care (Signed)
Patient has had a previous c/section and pre-eclampsia. She is familiar with plan of care and accepting of expected outcomes. Reviewed with her pre-op what to expect post- op.

## 2017-07-16 NOTE — Transfer of Care (Signed)
Immediate Anesthesia Transfer of Care Note  Patient: Diana Barretto  Procedure(s) Performed: CESAREAN SECTION (N/A )  Patient Location: PACU  Anesthesia Type:Spinal  Level of Consciousness: awake, alert , oriented and patient cooperative  Airway & Oxygen Therapy: Patient Spontanous Breathing  Post-op Assessment: Report given to RN and Post -op Vital signs reviewed and stable  Post vital signs: Reviewed and stable  Last Vitals:  Vitals:   07/16/17 2100 07/16/17 2115  BP:  (!) 174/103  Pulse:  (!) 108  Resp:  20  Temp:    SpO2: 98% 97%    Last Pain:  Vitals:   07/16/17 2034  TempSrc:   PainSc: 5       Patients Stated Pain Goal: 3 (70/35/00 9381)  Complications: No apparent anesthesia complications

## 2017-07-16 NOTE — Progress Notes (Signed)
Appreciate Dr Hurley Cisco consultation Patient now off magnesium   Vitals:   07/16/17 1143 07/16/17 1548  BP: 136/67 (!) 141/79  Pulse: 93 94  Resp: 16 16  Temp: 98.1 F (36.7 C) 98 F (36.7 C)  SpO2: 97% 96%   FHT cat one  A/P: preeclampsia without severe features         Magnesium off         Will give second dose BMZ about 8 pm         Finish 24 hour urine collection at home         D/C home this pm         Call for any sxs of preeclampsia         FU office 2-3 days

## 2017-07-16 NOTE — Discharge Instructions (Signed)
Preeclampsia and Eclampsia °Preeclampsia is a serious condition that develops only during pregnancy. It is also called toxemia of pregnancy. This condition causes high blood pressure along with other symptoms, such as swelling and headaches. These symptoms may develop as the condition gets worse. Preeclampsia may occur at 20 weeks of pregnancy or later. °Diagnosing and treating preeclampsia early is very important. If not treated early, it can cause serious problems for you and your baby. One problem it can lead to is eclampsia, which is a condition that causes muscle jerking or shaking (convulsions or seizures) in the mother. Delivering your baby is the best treatment for preeclampsia or eclampsia. Preeclampsia and eclampsia symptoms usually go away after your baby is born. °What are the causes? °The cause of preeclampsia is not known. °What increases the risk? °The following risk factors make you more likely to develop preeclampsia: °· Being pregnant for the first time. °· Having had preeclampsia during a past pregnancy. °· Having a family history of preeclampsia. °· Having high blood pressure. °· Being pregnant with twins or triplets. °· Being 35 or older. °· Being African-American. °· Having kidney disease or diabetes. °· Having medical conditions such as lupus or blood diseases. °· Being very overweight (obese). ° °What are the signs or symptoms? °The earliest signs of preeclampsia are: °· High blood pressure. °· Increased protein in your urine. Your health care provider will check for this at every visit before you give birth (prenatal visit). ° °Other symptoms that may develop as the condition gets worse include: °· Severe headaches. °· Sudden weight gain. °· Swelling of the hands, face, legs, and feet. °· Nausea and vomiting. °· Vision problems, such as blurred or double vision. °· Numbness in the face, arms, legs, and feet. °· Urinating less than usual. °· Dizziness. °· Slurred speech. °· Abdominal pain,  especially upper abdominal pain. °· Convulsions or seizures. ° °Symptoms generally go away after giving birth. °How is this diagnosed? °There are no screening tests for preeclampsia. Your health care provider will ask you about symptoms and check for signs of preeclampsia during your prenatal visits. You may also have tests that include: °· Urine tests. °· Blood tests. °· Checking your blood pressure. °· Monitoring your baby’s heart rate. °· Ultrasound. ° °How is this treated? °You and your health care provider will determine the treatment approach that is best for you. Treatment may include: °· Having more frequent prenatal exams to check for signs of preeclampsia, if you have an increased risk for preeclampsia. °· Bed rest. °· Reducing how much salt (sodium) you eat. °· Medicine to lower your blood pressure. °· Staying in the hospital, if your condition is severe. There, treatment will focus on controlling your blood pressure and the amount of fluids in your body (fluid retention). °· You may need to take medicine (magnesium sulfate) to prevent seizures. This medicine may be given as an injection or through an IV tube. °· Delivering your baby early, if your condition gets worse. You may have your labor started with medicine (induced), or you may have a cesarean delivery. ° °Follow these instructions at home: °Eating and drinking ° °· Drink enough fluid to keep your urine clear or pale yellow. °· Eat a healthy diet that is low in sodium. Do not add salt to your food. Check nutrition labels to see how much sodium a food or beverage contains. °· Avoid caffeine. °Lifestyle °· Do not use any products that contain nicotine or tobacco, such as cigarettes   and e-cigarettes. If you need help quitting, ask your health care provider.  Do not use alcohol or drugs.  Avoid stress as much as possible. Rest and get plenty of sleep. General instructions  Take over-the-counter and prescription medicines only as told by your  health care provider.  When lying down, lie on your side. This keeps pressure off of your baby.  When sitting or lying down, raise (elevate) your feet. Try putting some pillows underneath your lower legs.  Exercise regularly. Ask your health care provider what kinds of exercise are best for you.  Keep all follow-up and prenatal visits as told by your health care provider. This is important. How is this prevented? To prevent preeclampsia or eclampsia from developing during another pregnancy:  Get proper medical care during pregnancy. Your health care provider may be able to prevent preeclampsia or diagnose and treat it early.  Your health care provider may have you take a low-dose aspirin or a calcium supplement during your next pregnancy.  You may have tests of your blood pressure and kidney function after giving birth.  Maintain a healthy weight. Ask your health care provider for help managing weight gain during pregnancy.  Work with your health care provider to manage any long-term (chronic) health conditions you have, such as diabetes or kidney problems.  Contact a health care provider if:  You gain more weight than expected.  You have headaches.  You have nausea or vomiting.  You have abdominal pain.  You feel dizzy or light-headed. Get help right away if:  You develop sudden or severe swelling anywhere in your body. This usually happens in the legs.  You gain 5 lbs (2.3 kg) or more during one week.  You have severe: ? Abdominal pain. ? Headaches. ? Dizziness. ? Vision problems. ? Confusion. ? Nausea or vomiting.  You have a seizure.  You have trouble moving any part of your body.  You develop numbness in any part of your body.  You have trouble speaking.  You have any abnormal bleeding.  You pass out. This information is not intended to replace advice given to you by your health care provider. Make sure you discuss any questions you have with your health  care provider. Document Released: 08/04/2000 Document Revised: 04/04/2016 Document Reviewed: 03/13/2016 Elsevier Interactive Patient Education  2018 Day Call if severe headache, blurry vision, pain in upper abdomen or decreased fetal movement Call office for appointment on this Wednesday or Thursday

## 2017-07-16 NOTE — Anesthesia Preprocedure Evaluation (Signed)
Anesthesia Evaluation  Patient identified by MRN, date of birth, ID band Patient awake    Reviewed: Allergy & Precautions, H&P , Patient's Chart, lab work & pertinent test results  Airway Mallampati: II  TM Distance: >3 FB Neck ROM: full    Dental no notable dental hx.    Pulmonary    Pulmonary exam normal breath sounds clear to auscultation       Cardiovascular Exercise Tolerance: Good hypertension,  Rhythm:regular Rate:Normal     Neuro/Psych    GI/Hepatic   Endo/Other    Renal/GU      Musculoskeletal   Abdominal   Peds  Hematology   Anesthesia Other Findings   Reproductive/Obstetrics                             Anesthesia Physical  Anesthesia Plan  ASA: III and emergent  Anesthesia Plan: Spinal, Combined Spinal and Epidural and Epidural   Post-op Pain Management:    Induction:   PONV Risk Score and Plan:   Airway Management Planned:   Additional Equipment:   Intra-op Plan:   Post-operative Plan:   Informed Consent: I have reviewed the patients History and Physical, chart, labs and discussed the procedure including the risks, benefits and alternatives for the proposed anesthesia with the patient or authorized representative who has indicated his/her understanding and acceptance.   Dental Advisory Given  Plan Discussed with: CRNA  Anesthesia Plan Comments: (Lab work confirmed with CRNA in room. Platelets okay. Discussed spinal anesthetic, and patient consents to the procedure:  included risk of possible headache,backache, failed block, allergic reaction, and nerve injury. This patient was asked if she had any questions or concerns before the procedure started. )        Anesthesia Quick Evaluation

## 2017-07-17 ENCOUNTER — Encounter (HOSPITAL_COMMUNITY): Payer: Self-pay | Admitting: Obstetrics and Gynecology

## 2017-07-17 DIAGNOSIS — Z98891 History of uterine scar from previous surgery: Secondary | ICD-10-CM

## 2017-07-17 LAB — COMPREHENSIVE METABOLIC PANEL
ALT: 10 U/L — AB (ref 14–54)
ALT: 11 U/L — ABNORMAL LOW (ref 14–54)
ANION GAP: 6 (ref 5–15)
AST: 21 U/L (ref 15–41)
AST: 25 U/L (ref 15–41)
Albumin: 2.1 g/dL — ABNORMAL LOW (ref 3.5–5.0)
Albumin: 2 g/dL — ABNORMAL LOW (ref 3.5–5.0)
Alkaline Phosphatase: 117 U/L (ref 38–126)
Alkaline Phosphatase: 102 U/L (ref 38–126)
Anion gap: 6 (ref 5–15)
BILIRUBIN TOTAL: 0.4 mg/dL (ref 0.3–1.2)
BILIRUBIN TOTAL: 0.3 mg/dL (ref 0.3–1.2)
BUN: 15 mg/dL (ref 6–20)
BUN: 15 mg/dL (ref 6–20)
CHLORIDE: 104 mmol/L (ref 101–111)
CHLORIDE: 103 mmol/L (ref 101–111)
CO2: 21 mmol/L — ABNORMAL LOW (ref 22–32)
CO2: 21 mmol/L — ABNORMAL LOW (ref 22–32)
CREATININE: 0.73 mg/dL (ref 0.44–1.00)
Calcium: 6.7 mg/dL — ABNORMAL LOW (ref 8.9–10.3)
Calcium: 6.5 mg/dL — ABNORMAL LOW (ref 8.9–10.3)
Creatinine, Ser: 0.73 mg/dL (ref 0.44–1.00)
Glucose, Bld: 157 mg/dL — ABNORMAL HIGH (ref 65–99)
Glucose, Bld: 133 mg/dL — ABNORMAL HIGH (ref 65–99)
POTASSIUM: 5.1 mmol/L (ref 3.5–5.1)
Potassium: 4.8 mmol/L (ref 3.5–5.1)
Sodium: 131 mmol/L — ABNORMAL LOW (ref 135–145)
Sodium: 130 mmol/L — ABNORMAL LOW (ref 135–145)
TOTAL PROTEIN: 4.7 g/dL — AB (ref 6.5–8.1)
TOTAL PROTEIN: 4.6 g/dL — AB (ref 6.5–8.1)

## 2017-07-17 LAB — CBC
HCT: 23.8 % — ABNORMAL LOW (ref 36.0–46.0)
HEMATOCRIT: 19 % — AB (ref 36.0–46.0)
Hemoglobin: 6.2 g/dL — CL (ref 12.0–15.0)
Hemoglobin: 7.6 g/dL — ABNORMAL LOW (ref 12.0–15.0)
MCH: 25.9 pg — AB (ref 26.0–34.0)
MCH: 24.8 pg — AB (ref 26.0–34.0)
MCHC: 32.6 g/dL (ref 30.0–36.0)
MCHC: 31.9 g/dL (ref 30.0–36.0)
MCV: 79.5 fL (ref 78.0–100.0)
MCV: 77.8 fL — ABNORMAL LOW (ref 78.0–100.0)
PLATELETS: 156 10*3/uL (ref 150–400)
PLATELETS: 143 10*3/uL — AB (ref 150–400)
RBC: 2.39 MIL/uL — AB (ref 3.87–5.11)
RBC: 3.06 MIL/uL — ABNORMAL LOW (ref 3.87–5.11)
RDW: 14.6 % (ref 11.5–15.5)
RDW: 15.1 % (ref 11.5–15.5)
WBC: 15.3 10*3/uL — ABNORMAL HIGH (ref 4.0–10.5)
WBC: 13.9 10*3/uL — ABNORMAL HIGH (ref 4.0–10.5)

## 2017-07-17 LAB — CREATININE, URINE, RANDOM: CREATININE, URINE: 102.94 mg/dL

## 2017-07-17 LAB — SODIUM, URINE, RANDOM: SODIUM UR: 52 mmol/L

## 2017-07-17 MED ORDER — MENTHOL 3 MG MT LOZG
1.0000 | LOZENGE | OROMUCOSAL | Status: DC | PRN
  Administered 2017-07-17: 3 mg via ORAL
  Filled 2017-07-17: qty 9

## 2017-07-17 MED ORDER — DIBUCAINE 1 % RE OINT: 1.0000 "application " | TOPICAL_OINTMENT | RECTAL | Status: DC | PRN

## 2017-07-17 MED ORDER — SCOPOLAMINE 1 MG/3DAYS TD PT72
1.0000 | MEDICATED_PATCH | Freq: Once | TRANSDERMAL | Status: DC
  Filled 2017-07-17: qty 1

## 2017-07-17 MED ORDER — COCONUT OIL OIL: 1.0000 "application " | TOPICAL_OIL | Status: DC | PRN

## 2017-07-17 MED ORDER — PRENATAL MULTIVITAMIN CH: 1.0000 | ORAL_TABLET | Freq: Every day | ORAL | Status: DC

## 2017-07-17 MED ORDER — OXYCODONE HCL 5 MG PO TABS
10.0000 mg | ORAL_TABLET | ORAL | Status: DC | PRN
  Administered 2017-07-18 – 2017-07-21 (×3): 10 mg via ORAL
  Filled 2017-07-17 (×3): qty 2

## 2017-07-17 MED ORDER — OXYTOCIN 40 UNITS IN LACTATED RINGERS INFUSION - SIMPLE MED: 2.5000 [IU]/h | INTRAVENOUS | Status: DC

## 2017-07-17 MED ORDER — ZOLPIDEM TARTRATE 5 MG PO TABS: 5.0000 mg | ORAL_TABLET | Freq: Every evening | ORAL | Status: DC | PRN

## 2017-07-17 MED ORDER — SIMETHICONE 80 MG PO CHEW: 80.0000 mg | CHEWABLE_TABLET | ORAL | Status: DC | PRN

## 2017-07-17 MED ORDER — PANTOPRAZOLE SODIUM 40 MG PO TBEC
40.0000 mg | DELAYED_RELEASE_TABLET | Freq: Every day | ORAL | Status: DC
  Administered 2017-07-17 – 2017-07-21 (×5): 40 mg via ORAL
  Filled 2017-07-17 (×5): qty 1

## 2017-07-17 MED ORDER — OXYCODONE HCL 5 MG PO TABS
5.0000 mg | ORAL_TABLET | ORAL | Status: DC | PRN
Start: 2017-07-17 — End: 2017-07-21
  Administered 2017-07-17 – 2017-07-20 (×8): 5 mg via ORAL
  Filled 2017-07-17 (×8): qty 1

## 2017-07-17 MED ORDER — FERROUS SULFATE 325 (65 FE) MG PO TABS
325.0000 mg | ORAL_TABLET | Freq: Two times a day (BID) | ORAL | Status: DC
  Administered 2017-07-18 – 2017-07-21 (×7): 325 mg via ORAL
  Filled 2017-07-17 (×7): qty 1

## 2017-07-17 MED ORDER — MAGNESIUM SULFATE 40 G IN LACTATED RINGERS - SIMPLE
2.0000 g/h | INTRAVENOUS | Status: DC
  Administered 2017-07-17: 2 g/h via INTRAVENOUS
  Filled 2017-07-17: qty 40

## 2017-07-17 MED ORDER — SODIUM CHLORIDE 0.9% FLUSH
3.0000 mL | INTRAVENOUS | Status: DC | PRN
  Administered 2017-07-17: 3 mL via INTRAVENOUS
  Filled 2017-07-17: qty 3

## 2017-07-17 MED ORDER — TETANUS-DIPHTH-ACELL PERTUSSIS 5-2.5-18.5 LF-MCG/0.5 IM SUSP: 0.5000 mL | Freq: Once | INTRAMUSCULAR | Status: DC

## 2017-07-17 MED ORDER — ENOXAPARIN SODIUM 40 MG/0.4ML ~~LOC~~ SOLN: 40.0000 mg | SUBCUTANEOUS | Status: DC

## 2017-07-17 MED ORDER — SIMETHICONE 80 MG PO CHEW
80.0000 mg | CHEWABLE_TABLET | Freq: Three times a day (TID) | ORAL | Status: DC
  Administered 2017-07-17 – 2017-07-21 (×12): 80 mg via ORAL
  Filled 2017-07-17 (×12): qty 1

## 2017-07-17 MED ORDER — SIMETHICONE 80 MG PO CHEW
80.0000 mg | CHEWABLE_TABLET | ORAL | Status: DC
  Administered 2017-07-17 – 2017-07-20 (×4): 80 mg via ORAL
  Filled 2017-07-17 (×4): qty 1

## 2017-07-17 MED ORDER — SODIUM CHLORIDE 0.9 % IV SOLN: Freq: Once | INTRAVENOUS | Status: DC

## 2017-07-17 MED ORDER — SENNOSIDES-DOCUSATE SODIUM 8.6-50 MG PO TABS
2.0000 | ORAL_TABLET | ORAL | Status: DC
  Administered 2017-07-17 – 2017-07-20 (×4): 2 via ORAL
  Filled 2017-07-17 (×4): qty 2

## 2017-07-17 MED ORDER — SERTRALINE HCL 100 MG PO TABS
100.0000 mg | ORAL_TABLET | Freq: Every day | ORAL | Status: DC
  Administered 2017-07-17 – 2017-07-18 (×2): 100 mg via ORAL
  Filled 2017-07-17 (×3): qty 1

## 2017-07-17 MED ORDER — WITCH HAZEL-GLYCERIN EX PADS: 1.0000 "application " | MEDICATED_PAD | CUTANEOUS | Status: DC | PRN

## 2017-07-17 MED ORDER — ACETAMINOPHEN 325 MG PO TABS: 650.0000 mg | ORAL_TABLET | ORAL | Status: DC | PRN

## 2017-07-17 MED ORDER — LACTATED RINGERS IV SOLN
INTRAVENOUS | Status: DC
  Administered 2017-07-17: 12:00:00 via INTRAVENOUS

## 2017-07-17 MED ORDER — DIPHENHYDRAMINE HCL 50 MG/ML IJ SOLN: 12.5000 mg | INTRAMUSCULAR | Status: DC | PRN

## 2017-07-17 MED ORDER — ONDANSETRON HCL 4 MG/2ML IJ SOLN
4.0000 mg | Freq: Three times a day (TID) | INTRAMUSCULAR | Status: DC | PRN
  Filled 2017-07-17: qty 2

## 2017-07-17 MED ORDER — DIPHENHYDRAMINE HCL 25 MG PO CAPS
25.0000 mg | ORAL_CAPSULE | ORAL | Status: DC | PRN
  Filled 2017-07-17: qty 1

## 2017-07-17 MED ORDER — DIPHENHYDRAMINE HCL 25 MG PO CAPS
25.0000 mg | ORAL_CAPSULE | Freq: Four times a day (QID) | ORAL | Status: DC | PRN
  Administered 2017-07-17: 25 mg via ORAL

## 2017-07-17 MED ORDER — LACTATED RINGERS IV BOLUS (SEPSIS)
250.0000 mL | Freq: Once | INTRAVENOUS | Status: AC
  Administered 2017-07-17: 250 mL via INTRAVENOUS

## 2017-07-17 NOTE — Progress Notes (Signed)
Patient received to room 318 s/p repeat c/section for pre-eclampsia. Report received from Chanetta Marshall, Therapist, sports.

## 2017-07-17 NOTE — Pre-Procedure Instructions (Signed)
Patient without s/s anemia. Ambulated to chair. Continues to have no pain. CBC returned 7.6. Platelets mildly low. Recommend continuing to hold lovenox given risk of dropping high further if bleeds. Will redraw CBC in am to assess stability and then re consider lovenox. If stable, will restart. Continue iron. Uop continues to remain good. No s/s hypoNa.

## 2017-07-17 NOTE — Progress Notes (Signed)
FeNa returned <2% c/w hypovolemic hypoNa. UOP improved after increasing IVF to 100 cc/hr. Now 200 cc/hr. Will dec fluids now just to run [place]. Plan to recheck CMP in AM. No s/s hypoNa by exam or symptoms. CTM closely. RBC transfusion finished. If CBC appropriate will restart lovenox.   Arty Baumgartner MD

## 2017-07-17 NOTE — Addendum Note (Signed)
Addendum  created 07/17/17 5271 by Jonna Munro, CRNA   Sign clinical note

## 2017-07-17 NOTE — Progress Notes (Signed)
CRITICAL VALUE ALERT  Critical Value:  Hgb 6.2  Date & Time Notied:  07/17/17 0620 Provider Notified: Dr. Gaetano Net   Orders Received/Actions taken: No new orders

## 2017-07-17 NOTE — Op Note (Signed)
Diana Simpson, VOGT NO.:  [id number]  MEDICAL RECORD NO.:  [id number]  LOCATION:  FY10                          FACILITY:  [facility]  PHYSICIAN:  Daleen Bo. Gaetano Net, M.D. DATE OF BIRTH:  30-Jan-1986  DATE OF PROCEDURE:  07/16/2017 DATE OF DISCHARGE:                              OPERATIVE REPORT   PREOPERATIVE DIAGNOSES: 1. Intrauterine pregnancy at 34-6/7th weeks. 2. Preeclampsia with severe features. 3. Previous cesarean section, desires repeat.  POSTOPERATIVE DIAGNOSES: 1. Intrauterine pregnancy at 34-6/7th weeks. 2. Preeclampsia with severe features. 3. Previous cesarean section, desires repeat.  PROCEDURES: 1. Repeat low transverse cesarean section. 2. Removal of nevus from left groin.  SURGEON:  Daleen Bo. Gaetano Net, M.D.  ANESTHESIA:  Spinal, Heriberto Antigua, M.D.  ESTIMATED BLOOD LOSS:  1440 mL.  FINDINGS:  Viable female infant.  Apgars, arterial cord pH, birth weight pending.  SPECIMENS:  Placenta and nevus of left groin both to Pathology.  INDICATIONS AND CONSENT:  This patient is a 31 year old G2, P1, at 6- 6/7th weeks.  She was admitted yesterday for evaluation of preeclampsia. She was placed on magnesium sulfate, started on a betamethasone series. Laboratories were normal and blood pressures did not need to be treated. Today, she had ultrasound in consultation with Maternal Fetal Medicine, that demonstrated good growth, 5 pounds 5 ounces.  Outpatient management was recommended.  Magnesium sulfate was discontinued and she was going to be discharged after second dose of betamethasone.  She developed a headache.  She was given Tylenol, took a nap, and awoke with a headache that was worse than ever, crying with the pain.  Therefore, diagnosis of preeclampsia with severe features was made.  Recommendation for delivery was made.  Potential risks and complications were reviewed with the patient including, but not limited to, infection, organ damage,  bleeding requiring transfusion of blood products with HIV and hepatitis acquisition, DVT, PE, pneumonia.  The patient states she understands and agrees.  Consent was signed on the chart.  DESCRIPTION OF PROCEDURE:  The patient was taken to the operating room, where she was identified.  Spinal anesthetic was placed and she was placed in a dorsal supine position with a 15-degree left lateral wedge. She was then prepped vaginally.  Foley catheter was placed and she was prepped abdominally.  After a 3-minute drying time, she was draped in a sterile fashion.  Time-out has been undertaken.  After testing for adequate spinal anesthesia, skin was entered through a Pfannenstiel incision, taking out the old scar on the way in.  Dissection was carried out in layers to the peritoneum, which was taken down superiorly and inferiorly.  There was a band of the vesicouterine peritoneum, which was adherent about halfway up on the anterior uterine fundus.  This was taken down without difficulty and the bladder flap was developed and the bladder blade was placed.  There are numerous vessels in the peritoneum on the lower uterine segment.  The uterus was entered through a low transverse incision and the uterine cavity was entered bluntly with a hemostat.  Clear fluid was noted.  The uterine incision was extended cephalad laterally with fingers.  Good cry and tone were  noted.  After 1 minute, the cord was clamped and cut, and the baby was handed to awaiting Pediatrics team.  Placenta was manually delivered.  Uterus was exteriorized for better exposure.  Uterine cavity was clean and the uterus was closed in 2 running locking imbricating layers of 0 Monocryl suture.  Tubes and ovaries were normal.  Uterus was returned to the abdomen.  Copious lavage was carried out.  Numerous bleeders on the bladder flap are controlled both with brief cautery as well as closure of the bladder flap with 0 Monocryl which does  obtain hemostasis. Anterior peritoneum was closed in a running fashion with 0 Monocryl which was also used to reapproximate the pyramidalis muscles in the midline.  Anterior rectus fascia was closed in a running fashion with a 0 looped PDS.  Subcutaneous layer was closed with interrupted 2-0 plain and the skin was closed in subcuticular manner with 4-0 Vicryl on a Keith needle.  Benzoin, Steri-Strips, pressure dressing were applied. All counts were correct and the patient was transferred to the recovery room in stable condition.     Daleen Bo Gaetano Net, M.D.     JET/MEDQ  D:  07/16/2017  T:  07/17/2017  Job:  [id number]

## 2017-07-17 NOTE — Anesthesia Postprocedure Evaluation (Signed)
Anesthesia Post Note  Patient: Diana Simpson  Procedure(s) Performed: CESAREAN SECTION (N/A )     Patient location during evaluation: Mother Baby Anesthesia Type: Spinal Level of consciousness: awake and alert and oriented Pain management: satisfactory to patient Vital Signs Assessment: post-procedure vital signs reviewed and stable Respiratory status: spontaneous breathing and nonlabored ventilation Cardiovascular status: stable Postop Assessment: no headache, no backache, patient able to bend at knees, no signs of nausea or vomiting and adequate PO intake Anesthetic complications: no    Last Vitals:  Vitals:   07/17/17 0658 07/17/17 0750  BP:  140/80  Pulse:  71  Resp:  18  Temp:  36.5 C  SpO2: 97% 97%    Last Pain:  Vitals:   07/17/17 0750  TempSrc: Oral  PainSc:    Pain Goal: Patients Stated Pain Goal: 2 (07/17/17 0431)               Willa Rough

## 2017-07-17 NOTE — Progress Notes (Signed)
Patient w/out s/s anemia. Continues to have no pain. Tolerating her diet. Ambulated without dizziness to chair. Went to NICU. Mag due to come off at 2200. UOP excellent. CBC returned at 7.6, PLTs 140k. Continues to have benign exam. However, given, continued anemia and borderline PLTs, plan repeat CBC in AM and then consider lovenox then. Risks of restarting outweigh risks of low hgb at this time. If hgb/PLTs stable in am, will restart lovenox. Continue iron and holding NSAIDs.    Arty Baumgartner MD

## 2017-07-17 NOTE — Progress Notes (Signed)
Subjective: Postpartum Day 1: Cesarean Delivery Patient reports incisional pain and tolerating PO.   See RN documentation for more information of recent events. Briefly, she stood up to use the bathroom, felt dizzy, had emesis of approx 200cc, sat down and felt improved. Reports no current dizziness. Just feels tired. Incisional pain is well controlled with medications. Foley in place.   Objective: Vital signs in last 24 hours: Temp:  [97.4 F (36.3 C)-98.1 F (36.7 C)] 97.9 F (36.6 C) (11/27 0030) Pulse Rate:  [67-108] 71 (11/27 0750) Resp:  [12-20] 18 (11/27 0400) BP: (114-174)/(65-103) 140/80 (11/27 0750) SpO2:  [93 %-100 %] 97 % (11/27 7782)  Physical Exam:  General: alert, cooperative and appears stated age  Resp: lungs CTAB, NWOB Lochia: appropriate, b/l labia with mild edema Uterine Fundus: firm Abd: soft, nontender, nondistended, minimally ttp Incision: healing well, no significant drainage, no dehiscence, no significant erythema DVT Evaluation: No evidence of DVT seen on physical exam. Negative Homan's sign. No cords or calf tenderness. No significant calf/ankle edema.  Recent Labs    07/16/17 1956 07/17/17 0527  HGB 8.8* 6.2*  HCT 27.4* 19.0*    Assessment/Plan: Status post Cesarean section. Postoperative course complicated by anemia, mild hyponatremia. Overall stable and improving.   # PPH: preop hgb 8.8, post op 6.2 in setting of ~1500cc EBL which is appropriate.  She has a h/o FVL and had been on lovenox AP (last dose 11/25). Did have episode of hypotension/emesis in setting of getting up quickly overnight. Now improved. Exam benign and reassuring - no evidence of continued bleeding. Lochia has been scant.  Risks RBC transfusion discussed in detail including infection (HIV/Hepatitis) and transfusion reaction - pt consents. Will transfuse 2 units and recheck CBC 2-4 hours post transfusion.   # Mild hyponatremia: No s/s on exam. Patient with mixed picture -  appears to have mild labial edema on exam (b/l LW trace edema, lungs clear) but UOP overnight points towards hypovolemic HypoNa. UOP significantly improved with gently 250cc bolus - now 300cc in bag after only 1 hour. FeNa ordered. If low, confirms hypovolemia and will continue IVF for now - and cont to take PO. D/c pitocin.   # FVL: for proph anticoag pp. However, will start when risk of bleeding dec given hgb 6.2. No evidence of active bleeding on exam. However, given such low hgb, recommend RBC now and lovenox once improved. Tentative plan restart at 2000. PLTs 156k - plan to recheck prior to lovenox.  #Postpartum:  ADAT, colace Cont PO pain meds Hold NSAIDs as above Baby girl doing well in NICU.    Tyson Dense 07/17/2017, 7:50 AM

## 2017-07-17 NOTE — Lactation Note (Signed)
This note was copied from a baby's chart. Lactation Consultation Note  Patient Name: Diana Simpson [id number] Date: 07/17/2017 Reason for consult: Follow-up assessment   Baby 40 hours old in NICU.  [redacted]w[redacted]d.  PPH, Transfused, MagSO4. Mother has pumped and hand expressed approx 40 ml. Mother can easily hand express. Helped mother w/ pumping to check flange size. Recommend she pump q 3 hours in initiation setting with the exception of once during the night for rest. Encouraged mother when NICU allows to hold her infant STS and allow licks and nuzzles. Discussed milk storage and cleaning. Mother has DEBP at home. Provided mother w/ NICU booklet to read. Mom made aware of O/P services, breastfeeding support groups, community resources, and our phone # for post-discharge questions.     Maternal Data Has patient been taught Hand Expression?: Yes Does the patient have breastfeeding experience prior to this delivery?: (NICU 1st child, pumped for 14 mos.)  Feeding Feeding Type: Breast Milk(Breast milk and donor breast milk) Length of feed: 10 min  LATCH Score                   Interventions Interventions: Breast feeding basics reviewed;DEBP  Lactation Tools Discussed/Used Tools: Pump Breast pump type: Double-Electric Breast Pump Pump Review: Setup, frequency, and cleaning;Milk Storage Date initiated:: 07/17/17   Consult Status Consult Status: Follow-up Date: 07/18/17 Follow-up type: In-patient    Diana Simpson [facility] 07/17/2017, 5:45 PM

## 2017-07-17 NOTE — Progress Notes (Signed)
   07/17/17 0400  Vital Signs  BP 114/73  BP Location Right Arm  Patient Position (if appropriate) Sitting  BP Method Automatic  Pulse Rate (!) 102  Pulse Rate Source Monitor  Resp 18  Oxygen Therapy  SpO2 99 %  Called to room. Patient sitting on side of bed. Had just vomited approximately 200 ml dark fluid. Pale and c/o light headedness. Two people attending her. Pt denies further nausea after Zofran dose obtained and offered. Tech reports that patient had requested that her pad be changed and that was done. Linen saver noted to be soiled as well. Pt assisted to stand at side of bed for that to be changed as well. She began to feel light headed and became pale and vomited. Patient assisted back to semi-fowlers position and encouraged to rest for a while. Denies nausea.

## 2017-07-17 NOTE — Lactation Note (Signed)
This note was copied from a baby's chart. Lactation Consultation Note  Patient Name: Girl Edeline Greening [id number] Date: 07/17/2017 Reason for consult: Other (Comment)(per Lurena Nida, RN - mom is sleeping to she can visit baby in NICU at 1600 . per HROB - RN mom has pumped/ hand expressed 4 colostrum collectors ) and has been pumping with the DEBP.  Baby is 19 hours old / NICU/ LPT / Less then 5 pounds  Per HROB RN Hgb- 6.2. EBL 1400, and mom was transfused X 2. Mom also on MagSO4.     Maternal Data Has patient been taught Hand Expression?: Yes(per HROB mom has been shown )  Feeding Feeding Type: Breast Milk(Breast milk, and donor breast milk) Length of feed: 10 min  LATCH Score                   Interventions Interventions: Breast feeding basics reviewed;DEBP  Lactation Tools Discussed/Used Tools: Pump Breast pump type: Double-Electric Breast Pump Pump Review: Setup, frequency, and cleaning   Consult Status Consult Status: Follow-up Date: 07/17/17 Follow-up type: In-patient    [place] 07/17/2017, 2:31 PM

## 2017-07-18 ENCOUNTER — Other Ambulatory Visit: Payer: Self-pay

## 2017-07-18 LAB — BPAM RBC
BLOOD PRODUCT EXPIRATION DATE: 201812112359
BLOOD PRODUCT EXPIRATION DATE: 201812272359
Blood Product Expiration Date: 201812062359
Blood Product Expiration Date: 201812122359
Blood Product Expiration Date: 201812182359
Blood Product Expiration Date: 201812212359
ISSUE DATE / TIME: 201811270945
ISSUE DATE / TIME: 201811271206
UNIT TYPE AND RH: 600
UNIT TYPE AND RH: 600
UNIT TYPE AND RH: 9500
Unit Type and Rh: 600
Unit Type and Rh: 600
Unit Type and Rh: 9500

## 2017-07-18 LAB — TYPE AND SCREEN
ABO/RH(D): A NEG
Antibody Screen: NEGATIVE
UNIT DIVISION: 0
UNIT DIVISION: 0
Unit division: 0
Unit division: 0
Unit division: 0
Unit division: 0

## 2017-07-18 LAB — COMPREHENSIVE METABOLIC PANEL
ALBUMIN: 2.2 g/dL — AB (ref 3.5–5.0)
ALT: 13 U/L — ABNORMAL LOW (ref 14–54)
ANION GAP: 4 — AB (ref 5–15)
AST: 21 U/L (ref 15–41)
Alkaline Phosphatase: 95 U/L (ref 38–126)
BUN: 15 mg/dL (ref 6–20)
CHLORIDE: 106 mmol/L (ref 101–111)
CO2: 26 mmol/L (ref 22–32)
Calcium: 6.6 mg/dL — ABNORMAL LOW (ref 8.9–10.3)
Creatinine, Ser: 0.75 mg/dL (ref 0.44–1.00)
GFR calc Af Amer: >60 mL/min (ref >60)
GFR calc non Af Amer: >60 mL/min (ref >60)
GLUCOSE: 93 mg/dL (ref 65–99)
POTASSIUM: 4.7 mmol/L (ref 3.5–5.1)
SODIUM: 136 mmol/L (ref 135–145)
TOTAL PROTEIN: 5 g/dL — AB (ref 6.5–8.1)
Total Bilirubin: 0.7 mg/dL (ref 0.3–1.2)

## 2017-07-18 LAB — CBC
HCT: 23 % — ABNORMAL LOW (ref 36.0–46.0)
HEMATOCRIT: 21.7 % — AB (ref 36.0–46.0)
HEMOGLOBIN: 7.1 g/dL — AB (ref 12.0–15.0)
Hemoglobin: 7.3 g/dL — ABNORMAL LOW (ref 12.0–15.0)
MCH: 25.9 pg — ABNORMAL LOW (ref 26.0–34.0)
MCH: 25.1 pg — AB (ref 26.0–34.0)
MCHC: 32.7 g/dL (ref 30.0–36.0)
MCHC: 31.7 g/dL (ref 30.0–36.0)
MCV: 79.2 fL (ref 78.0–100.0)
MCV: 79 fL (ref 78.0–100.0)
PLATELETS: 122 10*3/uL — AB (ref 150–400)
Platelets: 120 10*3/uL — ABNORMAL LOW (ref 150–400)
RBC: 2.74 MIL/uL — ABNORMAL LOW (ref 3.87–5.11)
RBC: 2.91 MIL/uL — AB (ref 3.87–5.11)
RDW: 16 % — ABNORMAL HIGH (ref 11.5–15.5)
RDW: 16.1 % — ABNORMAL HIGH (ref 11.5–15.5)
WBC: 11.8 10*3/uL — ABNORMAL HIGH (ref 4.0–10.5)
WBC: 11.4 10*3/uL — ABNORMAL HIGH (ref 4.0–10.5)

## 2017-07-18 NOTE — Lactation Note (Signed)
This note was copied from a baby's chart. Lactation Consultation Note  Patient Name: Diana Simpson [id number] Date: 07/18/2017 Reason for consult: Follow-up assessment   Baby 55 hours old in NICU. Mother states she is a lot of pain (recently received medication) and is tired. She states she has not wanted to pump because she doesn't feel good. She has been hand expressing and recently sent colostrum container of breastmilk to the NICU. Encouraged mother to continue hand expressing until she feels better to protect her milk supply. Her breasts are starting to feel heavy. Encouraged mother to start pumping again once able. Discussed supply and demand and the benefits of breastmilk. No questions at this time.   Maternal Data    Feeding Feeding Type: Donor Breast Milk Nipple Type: Slow - flow Length of feed: 30 min  LATCH Score                   Interventions    Lactation Tools Discussed/Used     Consult Status Consult Status: PRN    Carlye Grippe 07/18/2017, 10:07 PM

## 2017-07-18 NOTE — Plan of Care (Signed)
Patient verbalizes understanding of her condition and managing pain. Input encouraged in making decisions about her treatments and activity during her recovery.

## 2017-07-18 NOTE — Progress Notes (Signed)
Pt feels mildly dizzy with ambulation but overall feeling better.  No HA, n/v, or CP Pain controlled with PO meds.  Baby in NICU  AF, VSS BP 130-140/70s-80s HR 70s Gen - NAD Abd - soft, NT/ND + BS Bandage c/d/i Ext - NT  A/P:  POD#2 s/p c-section for severe pre-e, baby in NICU (34 wks) S/p Magnesium Anemia - Hbg 7.1 s/p 2u PRBC.  Will repeat CBC this afternoon. Factor V Leiden - rec pt wear SCDs while sitting or in bed.  Encouraged ambulation. Will restart lovenox when Hgb/plts stable.

## 2017-07-18 NOTE — Progress Notes (Signed)
CSW acknowledges NICU admission.    Patient screened out for psychosocial assessment since none of the following apply:  Psychosocial stressors documented in mother or baby's chart  Gestation less than 32 weeks  Code at delivery   Infant with anomalies  Please contact the Clinical Social Worker if specific needs arise, or by MOB's request.       

## 2017-07-19 ENCOUNTER — Encounter (HOSPITAL_COMMUNITY): Payer: Self-pay | Admitting: *Deleted

## 2017-07-19 LAB — CBC
HCT: 28.3 % — ABNORMAL LOW (ref 36.0–46.0)
HEMATOCRIT: 21.1 % — AB (ref 36.0–46.0)
Hemoglobin: 6.6 g/dL — CL (ref 12.0–15.0)
Hemoglobin: 9.2 g/dL — ABNORMAL LOW (ref 12.0–15.0)
MCH: 25.2 pg — ABNORMAL LOW (ref 26.0–34.0)
MCH: 26.6 pg (ref 26.0–34.0)
MCHC: 31.3 g/dL (ref 30.0–36.0)
MCHC: 32.5 g/dL (ref 30.0–36.0)
MCV: 80.5 fL (ref 78.0–100.0)
MCV: 81.8 fL (ref 78.0–100.0)
PLATELETS: 122 10*3/uL — AB (ref 150–400)
PLATELETS: 120 10*3/uL — AB (ref 150–400)
RBC: 2.62 MIL/uL — ABNORMAL LOW (ref 3.87–5.11)
RBC: 3.46 MIL/uL — ABNORMAL LOW (ref 3.87–5.11)
RDW: 16.2 % — AB (ref 11.5–15.5)
RDW: 15.5 % (ref 11.5–15.5)
WBC: 10.8 10*3/uL — AB (ref 4.0–10.5)
WBC: 11 10*3/uL — ABNORMAL HIGH (ref 4.0–10.5)

## 2017-07-19 LAB — PREPARE RBC (CROSSMATCH)

## 2017-07-19 MED ORDER — SODIUM CHLORIDE 0.9 % IV SOLN
Freq: Once | INTRAVENOUS | Status: AC
  Administered 2017-07-19: 09:00:00 via INTRAVENOUS

## 2017-07-19 MED ORDER — ENOXAPARIN SODIUM 40 MG/0.4ML ~~LOC~~ SOLN
40.0000 mg | SUBCUTANEOUS | Status: DC
  Filled 2017-07-19 (×4): qty 0.4

## 2017-07-19 MED ORDER — SERTRALINE HCL 100 MG PO TABS
200.0000 mg | ORAL_TABLET | Freq: Every day | ORAL | Status: DC
Start: 2017-07-19 — End: 2017-07-21
  Administered 2017-07-19 – 2017-07-20 (×2): 200 mg via ORAL
  Filled 2017-07-19 (×3): qty 2

## 2017-07-19 NOTE — Progress Notes (Signed)
Subjective: Postpartum Day 3: Cesarean Delivery Patient reports dizziness, advises her normal ZOLOFT dose is 200mg  po QD  Objective: Vital signs in last 24 hours: Temp:  [98.6 F (37 C)-99.4 F (37.4 C)] 98.6 F (37 C) (11/29 0830) Pulse Rate:  [62-76] 62 (11/29 0830) Resp:  [16-20] 20 (11/29 0830) BP: (127-151)/(68-93) 151/82 (11/29 0830) SpO2:  [97 %-100 %] 99 % (11/29 0830)  Physical Exam:  General: alert Lochia: appropriate Uterine Fundus: firm Incision: healing well DVT Evaluation: No evidence of DVT seen on physical exam.  Recent Labs    07/18/17 1122 07/19/17 0520  HGB 7.3* 6.6*  HCT 23.0* 21.1*    Assessment/Plan: Status post Cesarean section. POD #3>>>still w/ symptomatic post op anemia>>>discussed further transfusion>>will admin 2 u PRBC today, poss D/C in am  Continue current care.  [place] 07/19/2017, 8:48 AM

## 2017-07-19 NOTE — Progress Notes (Signed)
CRITICAL VALUE ALERT  Critical Value: Hgb 6.6   Date & Time Notied:  07/19/17 @ 0620  Provider Notified: Dr. Julien Girt  Orders Received/Actions taken: No new orders. Will evaluate patient on rounds

## 2017-07-19 NOTE — Progress Notes (Signed)
1 unit Packed RBC admin 1113. RN verified by Lurena Nida, RN.  Patient education complete, consent signed.  No pre-transfusion meds orders.

## 2017-07-19 NOTE — Progress Notes (Signed)
Plan of care: patient has made improvement in activity tolerance. Moving and walking more with the encouragement of family and staff. Reassure and praise given for all effort made. Patient has had a crying spell this shift with much emotional support needed to even get out of the bed for the bathroom. Patient states that she takes 200 mg of the Zoloft every night at home, but has only received 100 mg each night while here.  Patient had a nap of about 2 hours and got up to use the breast pump. Much encouragement given and a review of expected level of activity and length of time to full recovery. Patient able at this time to ambulate independently to the NICU to visit with her daughter.

## 2017-07-19 NOTE — Progress Notes (Signed)
Pt states she is "disoriented and confused as to what she is supposed to being doing".  Upon assessment patient is pale, alert and oriented x4. Pt encouraged to rest.  MD due to round on patient.   Pt assisted back to bed, instructed to call and wait for assistance with ambulation.

## 2017-07-19 NOTE — Progress Notes (Signed)
2nd Unit of Packed RBC started at 1244- 2nd Rn verification by Lurena Nida, RN.

## 2017-07-19 NOTE — Lactation Note (Addendum)
This note was copied from a baby's chart. Lactation Consultation Note  Patient Name: Diana Simpson [id number] Date: 07/19/2017 Reason for consult: Follow-up assessment;NICU baby;Infant < 6lbs   Follow up with mom of 66 hour old NICU infant. Mom reports she pumped at MN and at 1 pm today. She obtained 60 cc from each breast with last pumping. Mom is full but not engorged. Reviewed Engorgement prevention/treatment.  Mom aware that she needs to be pumping more regularly. Mom was given more storage bottles. Mom with no further questions/concerns at this time.   Mom has Wilson at home for use.    Maternal Data Formula Feeding for Exclusion: No Has patient been taught Hand Expression?: Yes Does the patient have breastfeeding experience prior to this delivery?: Yes  Feeding Feeding Type: Donor Breast Milk  LATCH Score                   Interventions    Lactation Tools Discussed/Used Pump Review: Setup, frequency, and cleaning;Milk Storage Initiated by:: Reviewed and encouraged every 2-3 hours   Consult Status Consult Status: Follow-up Date: 07/20/17 Follow-up type: In-patient    [name] [name] 07/19/2017, 4:15 PM

## 2017-07-20 LAB — CBC
HCT: 27.5 % — ABNORMAL LOW (ref 36.0–46.0)
Hemoglobin: 8.9 g/dL — ABNORMAL LOW (ref 12.0–15.0)
MCH: 26.5 pg (ref 26.0–34.0)
MCHC: 32.4 g/dL (ref 30.0–36.0)
MCV: 81.8 fL (ref 78.0–100.0)
Platelets: 121 10*3/uL — ABNORMAL LOW (ref 150–400)
RBC: 3.36 MIL/uL — ABNORMAL LOW (ref 3.87–5.11)
RDW: 15.7 % — AB (ref 11.5–15.5)
WBC: 10.2 10*3/uL (ref 4.0–10.5)

## 2017-07-20 LAB — TYPE AND SCREEN
ABO/RH(D): A NEG
Antibody Screen: NEGATIVE
UNIT DIVISION: 0
Unit division: 0

## 2017-07-20 LAB — BPAM RBC
BLOOD PRODUCT EXPIRATION DATE: 201812122359
Blood Product Expiration Date: 201812112359
ISSUE DATE / TIME: 201811291224
ISSUE DATE / TIME: 201811291053
UNIT TYPE AND RH: 600
Unit Type and Rh: 600

## 2017-07-20 MED ORDER — SODIUM CHLORIDE 0.9 % IV SOLN
510.0000 mg | Freq: Once | INTRAVENOUS | Status: AC
  Administered 2017-07-20: 510 mg via INTRAVENOUS
  Filled 2017-07-20: qty 17

## 2017-07-20 NOTE — Lactation Note (Signed)
This note was copied from a baby's chart. Lactation Consultation Note  Patient Name: Diana Simpson [id number] Date: 07/20/2017 Reason for consult: Follow-up assessment;Nipple pain/trauma;Late-preterm 34-36.6wks;Infant < 6lbs   Follow up with mom of 67 hour old NICU infant. Mom reports she is pumping and getting up to 120 cc with pumping. Mom is noted to have some areolar edema this morning and edema to the outer aspect of the breasts, enc mom to massage the areas and to make sure she is pumping 8-12 x in 24 hours to prevent engorgement. Mom reports she is not being d/c home today. Mom to call with any questions/concerns prn.    Maternal Data    Feeding    LATCH Score                   Interventions    Lactation Tools Discussed/Used     Consult Status Consult Status: Follow-up Date: 07/21/17 Follow-up type: In-patient    [name] 07/20/2017, 10:21 AM

## 2017-07-20 NOTE — Progress Notes (Signed)
Patient doing well.  Feeling a little better.  BP 132/87 (BP Location: Right Arm)   Pulse 70   Temp 98.5 F (36.9 C) (Oral)   Resp 18   Ht 5' 2.5" (1.588 m)   Wt 93.9 kg (207 lb)   SpO2 100%   Breastfeeding? Unknown   BMI 37.26 kg/m  Results for orders placed or performed during the hospital encounter of 07/15/17 (from the past 24 hour(s))  Prepare RBC     Status: None   Collection Time: 07/19/17  9:27 AM  Result Value Ref Range   Order Confirmation ORDER PROCESSED BY BLOOD BANK   Type and screen Diana Simpson     Status: None (Preliminary result)   Collection Time: 07/19/17  9:27 AM  Result Value Ref Range   ABO/RH(D) A NEG    Antibody Screen NEG    Sample Expiration 07/22/2017    Unit Number [id number]    Blood Component Type RED CELLS,LR    Unit division 00    Status of Unit ISSUED    Transfusion Status OK TO TRANSFUSE    Crossmatch Result Compatible    Unit Number [id number]    Blood Component Type RED CELLS,LR    Unit division 00    Status of Unit ISSUED    Transfusion Status OK TO TRANSFUSE    Crossmatch Result Compatible   CBC     Status: Abnormal   Collection Time: 07/19/17  3:58 PM  Result Value Ref Range   WBC 11.0 (H) 4.0 - 10.5 K/uL   RBC 3.46 (L) 3.87 - 5.11 MIL/uL   Hemoglobin 9.2 (L) 12.0 - 15.0 g/dL   HCT 28.3 (L) 36.0 - 46.0 %   MCV 81.8 78.0 - 100.0 fL   MCH 26.6 26.0 - 34.0 pg   MCHC 32.5 30.0 - 36.0 g/dL   RDW 15.5 11.5 - 15.5 %   Platelets 120 (L) 150 - 400 K/uL  CBC     Status: Abnormal   Collection Time: 07/20/17  5:53 AM  Result Value Ref Range   WBC 10.6 (H) 4.0 - 10.5 K/uL   RBC 3.44 (L) 3.87 - 5.11 MIL/uL   Hemoglobin 8.8 (L) 12.0 - 15.0 g/dL   HCT 27.8 (L) 36.0 - 46.0 %   MCV 80.8 78.0 - 100.0 fL   MCH 25.6 (L) 26.0 - 34.0 pg   MCHC 31.7 30.0 - 36.0 g/dL   RDW 15.5 11.5 - 15.5 %   Platelets 117 (L) 150 - 400 K/uL   Abdomen is soft and non tender Bandage clean and dry  POD # 4  Preeclampsia C  Section Anemia requiring blood transfusion  I am going to administer feraheme today  Recheck CBC today at noon Possible discharge home tomorrow

## 2017-07-21 LAB — CBC
HCT: 28.2 % — ABNORMAL LOW (ref 36.0–46.0)
Hemoglobin: 9.3 g/dL — ABNORMAL LOW (ref 12.0–15.0)
MCH: 27 pg (ref 26.0–34.0)
MCHC: 33 g/dL (ref 30.0–36.0)
MCV: 81.7 fL (ref 78.0–100.0)
PLATELETS: 127 10*3/uL — AB (ref 150–400)
RBC: 3.45 MIL/uL — AB (ref 3.87–5.11)
RDW: 15.9 % — ABNORMAL HIGH (ref 11.5–15.5)
WBC: 10.3 10*3/uL (ref 4.0–10.5)

## 2017-07-21 MED ORDER — TRAMADOL HCL 50 MG PO TABS: 50.0000 mg | ORAL_TABLET | Freq: Two times a day (BID) | ORAL | Status: DC | PRN

## 2017-07-21 MED ORDER — CYCLOBENZAPRINE HCL 10 MG PO TABS
10.0000 mg | ORAL_TABLET | Freq: Three times a day (TID) | ORAL | Status: DC
  Administered 2017-07-21: 10 mg via ORAL
  Filled 2017-07-21 (×4): qty 1

## 2017-07-21 MED ORDER — FERROUS SULFATE 325 (65 FE) MG PO TABS: 325.0000 mg | ORAL_TABLET | Freq: Two times a day (BID) | ORAL | 3 refills | Status: DC

## 2017-07-21 MED ORDER — TRAMADOL HCL 50 MG PO TABS: 50.0000 mg | ORAL_TABLET | Freq: Two times a day (BID) | ORAL | 0 refills | Status: DC | PRN

## 2017-07-21 MED ORDER — CYCLOBENZAPRINE HCL 10 MG PO TABS: 10.0000 mg | ORAL_TABLET | Freq: Three times a day (TID) | ORAL | 0 refills | Status: DC

## 2017-07-21 MED ORDER — OXYCODONE HCL 5 MG PO TABS: 5.0000 mg | ORAL_TABLET | ORAL | 0 refills | Status: DC | PRN

## 2017-07-21 NOTE — Discharge Summary (Signed)
Admission Diagnosis: IUP  Preeclampsia Factor V Leiden Mutation Previous C Section  Discharge Diagnosis: Same  Hospital Course: 31 year old female admitted with preeclampsia. She was administered steroids and expectantly managed because she was pre term.  When she developed severe features, a C Section was performed complicated by intrapartum hemorrhage.  Anemia corrected with prbcs and feraheme infustion. She was discharged home in good condition. Lovenox was restarted when hemoglobin stabilized.  Given usual discharge precaution Follow up in 1 week  Results for orders placed or performed during the hospital encounter of 07/15/17 (from the past 24 hour(s))  CBC     Status: Abnormal   Collection Time: 07/20/17 12:24 PM  Result Value Ref Range   WBC 10.2 4.0 - 10.5 K/uL   RBC 3.36 (L) 3.87 - 5.11 MIL/uL   Hemoglobin 8.9 (L) 12.0 - 15.0 g/dL   HCT 27.5 (L) 36.0 - 46.0 %   MCV 81.8 78.0 - 100.0 fL   MCH 26.5 26.0 - 34.0 pg   MCHC 32.4 30.0 - 36.0 g/dL   RDW 15.7 (H) 11.5 - 15.5 %   Platelets 121 (L) 150 - 400 K/uL  CBC     Status: Abnormal   Collection Time: 07/21/17  5:15 AM  Result Value Ref Range   WBC 10.3 4.0 - 10.5 K/uL   RBC 3.45 (L) 3.87 - 5.11 MIL/uL   Hemoglobin 9.3 (L) 12.0 - 15.0 g/dL   HCT 28.2 (L) 36.0 - 46.0 %   MCV 81.7 78.0 - 100.0 fL   MCH 27.0 26.0 - 34.0 pg   MCHC 33.0 30.0 - 36.0 g/dL   RDW 15.9 (H) 11.5 - 15.5 %   Platelets 127 (L) 150 - 400 K/uL

## 2017-07-21 NOTE — Lactation Note (Signed)
This note was copied from a baby's chart. Lactation Consultation Note  Patient Name: Diana Simpson [id number] Date: 07/21/2017  Lactation Consultant went to follow-up with mom of NICU infant but mom stated she was trying to rest so asked for Lactation to come back later.   Maternal Data    Feeding Feeding Type: Breast Milk Nipple Type: Slow - flow Length of feed: 30 min  LATCH Score                   Interventions    Lactation Tools Discussed/Used     Consult Status      Yvonna Alanis 07/21/2017, 9:45 AM

## 2017-07-21 NOTE — Lactation Note (Signed)
This note was copied from a baby's chart. Lactation Consultation Note  Patient Name: Diana Simpson [id number] Date: 07/21/2017 Reason for consult: Follow-up assessment;NICU baby;Late-preterm 34-36.6wks;Infant < 6lbs Infant is 24 days old & in the NICU; Lactation saw mom for follow-up assessment in her room. Mom was pumping when LC entered & massaging while pumping. Mom reports that she has not been pumping regularly because she took sleeping medication and more pain medication so has been sleeping. Mom pumped ~277mL at 4:30am and had almost filled 2 bottles while pumping now. Mom's right nipple appeared to be rubbing the side of the flange so suggested mom try the 42mm flange for her right breast and it did fit better and mom reported they felt comfortable. Mom reports her breasts have been sore but thinks it is because she has not been pumping often. Reinforced importance of pumping 8-12x in 24hrs or q 2-3 hrs for 15-20 mins followed by hand expression.  Discussed engorgement prevention & treatment. Provided mom with more bottles to take home & brought an ice pack to mom to put on breasts after pumping. Mom reports no questions. Reminded mom of the Lactation number and encouraged mom to call with any questions.  Maternal Data    Feeding Feeding Type: Breast Milk Nipple Type: Slow - flow Length of feed: 30 min  LATCH Score                   Interventions    Lactation Tools Discussed/Used     Consult Status Consult Status: Complete    Diana Simpson 07/21/2017, 1:16 PM

## 2017-07-21 NOTE — Discharge Summary (Signed)
Obstetric Discharge Summary Reason for Admission: Preeclampsia Prenatal Procedures: NST, Preeclampsia and ultrasound Intrapartum Procedures: cesarean: low cervical, transverse Postpartum Procedures: transfusion prbcs Complications-Operative and Postpartum: none Hemoglobin  Date Value Ref Range Status  07/21/2017 9.3 (L) 12.0 - 15.0 g/dL Final   HCT  Date Value Ref Range Status  07/21/2017 28.2 (L) 36.0 - 46.0 % Final    Physical Exam:  General: alert, cooperative and appears stated age 31: appropriate Uterine Fundus: firm Incision: healing well, no significant drainage, no dehiscence DVT Evaluation: No evidence of DVT seen on physical exam.  Discharge Diagnoses: Preelampsia  Discharge Information: Date: 07/21/2017 Activity: pelvic rest Diet: routine Medications: Iron, Percocet and lovenox, flexeril and ultram Condition: stable Instructions: refer to practice specific booklet Discharge to: home   Newborn Data: Live born female  Birth Weight: 4 lb 14.7 oz (2230 g) APGAR: 7, 8  Newborn Delivery   Birth date/time:  07/16/2017 22:10:00 Delivery type:  C-Section, Low Transverse C-section categorization:  Repeat     Home with NICU.  Diana Simpson L 07/21/2017, 8:04 AM

## 2017-07-23 LAB — CBC
HCT: 27.8 % — ABNORMAL LOW (ref 36.0–46.0)
HEMOGLOBIN: 8.8 g/dL — AB (ref 12.0–15.0)
MCH: 25.6 pg — AB (ref 26.0–34.0)
MCHC: 31.7 g/dL (ref 30.0–36.0)
MCV: 80.8 fL (ref 78.0–100.0)
PLATELETS: 117 10*3/uL — AB (ref 150–400)
RBC: 3.44 MIL/uL — ABNORMAL LOW (ref 3.87–5.11)
RDW: 15.5 % (ref 11.5–15.5)
WBC: 10.6 10*3/uL — ABNORMAL HIGH (ref 4.0–10.5)

## 2017-11-20 DIAGNOSIS — R3129 Other microscopic hematuria: Secondary | ICD-10-CM

## 2017-11-20 DIAGNOSIS — R339 Retention of urine, unspecified: Secondary | ICD-10-CM

## 2017-11-20 HISTORY — DX: Retention of urine, unspecified: R33.9

## 2017-11-20 HISTORY — DX: Other microscopic hematuria: R31.29

## 2017-12-12 ENCOUNTER — Telehealth: Payer: Self-pay | Admitting: *Deleted

## 2017-12-12 NOTE — Telephone Encounter (Signed)
Called and left the patient a message to call the office back. Patient needs to be scheduled for a new patient appt  °

## 2017-12-19 ENCOUNTER — Encounter: Payer: Self-pay | Admitting: *Deleted

## 2017-12-21 ENCOUNTER — Inpatient Hospital Stay: Payer: Managed Care, Other (non HMO) | Attending: Gynecologic Oncology | Admitting: Gynecologic Oncology

## 2017-12-21 ENCOUNTER — Encounter: Payer: Self-pay | Admitting: Gynecologic Oncology

## 2017-12-21 VITALS — BP 126/74 | HR 86 | Temp 98.3°F | Resp 20 | Ht 62.0 in | Wt 179.2 lb

## 2017-12-21 DIAGNOSIS — N39490 Overflow incontinence: Secondary | ICD-10-CM | POA: Insufficient documentation

## 2017-12-21 DIAGNOSIS — R1900 Intra-abdominal and pelvic swelling, mass and lump, unspecified site: Secondary | ICD-10-CM | POA: Insufficient documentation

## 2017-12-21 DIAGNOSIS — N809 Endometriosis, unspecified: Secondary | ICD-10-CM | POA: Diagnosis not present

## 2017-12-21 NOTE — Patient Instructions (Signed)
Dr Denman George has ordered an MRI of the pelvis to better look at the mass seen on CT scan.  She recommends pumping and dumping for 24 hours post MRI.  Dr Denman George will discuss the MRI with Dr Tresa Commerford and see you back for discussion after the study has been performed.  Her office can be reached at [phone] for questions.

## 2017-12-21 NOTE — Progress Notes (Signed)
Consult Note: Gyn-Onc  Consult was requested by Dr. Tresa Takeda for the evaluation of Wealthy Danielski 32 y.o. female  CC:  Chief Complaint  Patient presents with  . Pelvic mass    Assessment/Plan:  Ms. Francies Inch  is a 32 y.o.  year old with microscopic hematuria and sensation of incomplete emptying and an indeterminate mass associated with the anterior wall of lower uterine segment and posterior wall of the urinary bladder.   Given her complex obstetric history, I suspect this lesion may be an endometrioma collected through the cesarean section scar.  I am recommending MRI pelvis to better characterize the lesion and its origins. I will then discuss its findings with Dr Tresa Devos and determine our recommendations for surgery vs expectant management.    HPI: Ms Vincent Ehrler is a very pleasant 32 year old woman who is seen in consultation at the request of Dr Tresa Clucas for a mass in the vesicouterine plane.   Ms. Gutter underwent a cesarean section for her second child on July 16, 2017.  This was performed at approximately [redacted] weeks gestational age due to severe preeclampsia.  Her cesarean section was complicated by maternal hemorrhage from what appears to be the bladder flap.  Review of the operative note identifies that there was profuse vascularity identified in the bladder flap during surgery which required suturing and electrocautery to establish hemostasis.  No specific masses were identified within this space, and there was no apparent percreta or accreta identified at the time of cesarean section.  Immediately postoperatively after a Foley catheter was removed the patient began experiencing pain with urination.  It was severe pain and she developed fear of voiding from this.  She denies having failed voiding trials post catheter removal or needing catheterization reattempted.  Gradually over the course of several weeks the pain with voiding improved.  However what ensued at that time was the  development of an insensate bladder.  She no longer feels the urge to void.  She needs to remind herself to void intermittently.  She has a sense sensation of incomplete emptying.  She denies identifying gross hematuria.  And she denies any pain with micturition.  She has had one menstrual.  Approximately 6 weeks after her cesarean section that was fairly normal.  She has had no menses since that time however she is continuing to breast-feed.  Due to the finding of microscopic hematuria on urine analysis and her sensation of incomplete emptying she was referred for evaluation by Dr. Phebe Colla at [facility].   A cystoscopy was performed which did not identify a mass within the bladder however there was some mass-effect on the bladder trigone.  A CT abdomen and pelvis with and without contrast was performed on April 16th, 2019 at [facility] which revealed a well-circumscribed indeterminate solid-appearing structure between the anterior lower uterine segment and posterior wall of the urinary bladder.  It measured 2.7 x 3.9 cm.  The uterus is otherwise unremarkable.  No adnexal masses was seen.  The mass appears to course very close to the ureter and trigone.  Of other significance Ms. Morici is positive for factor V Leiden.   She no longer desires future childbearing due to complications in prior pregnancies.   Current Meds:  Outpatient Encounter Medications as of 12/21/2017  Medication Sig  . cetirizine (ZYRTEC) 10 MG tablet Take 10 mg by mouth daily.  . Prenatal Vit-Fe Fumarate-FA (PRENATAL MULTIVITAMIN) TABS tablet Take 1 tablet by mouth daily.   Marland Kitchen  sertraline (ZOLOFT) 100 MG tablet Take 1 tablet (100 mg total) by mouth daily. (Patient taking differently: Take 100 mg by mouth at bedtime. Take 2 100mg  by mouth daily)  . CAMILA 0.35 MG tablet Take 1 tablet by mouth daily.  Marland Kitchen esomeprazole (NEXIUM) 20 MG capsule Take by mouth daily.  . [DISCONTINUED] acetaminophen (TYLENOL) 500 MG tablet  Take 500 mg by mouth every 6 (six) hours as needed for mild pain or headache.  . [DISCONTINUED] aspirin 81 MG tablet Take 81 mg by mouth daily.  . [DISCONTINUED] cyclobenzaprine (FLEXERIL) 10 MG tablet Take 1 tablet (10 mg total) by mouth 3 (three) times daily. (Patient not taking: Reported on 12/21/2017)  . [DISCONTINUED] enoxaparin (LOVENOX) 40 MG/0.4ML injection Inject 40 mg at bedtime into the skin.  . [DISCONTINUED] ferrous sulfate 325 (65 FE) MG tablet Take 1 tablet (325 mg total) by mouth 2 (two) times daily with a meal. (Patient not taking: Reported on 12/21/2017)  . [DISCONTINUED] oxyCODONE (OXY IR/ROXICODONE) 5 MG immediate release tablet Take 1 tablet (5 mg total) by mouth every 4 (four) hours as needed (pain scale 4-7). (Patient not taking: Reported on 12/21/2017)  . [DISCONTINUED] traMADol (ULTRAM) 50 MG tablet Take 1 tablet (50 mg total) by mouth every 12 (twelve) hours as needed for severe pain. (Patient not taking: Reported on 12/21/2017)   No facility-administered encounter medications on file as of 12/21/2017.     Allergy:  Allergies  Allergen Reactions  . Sulfa Antibiotics Other (See Comments)    Childhood reaction    Social Hx:   Social History   Socioeconomic History  . Marital status: Married    Spouse name: Not on file  . Number of children: Not on file  . Years of education: Not on file  . Highest education level: Not on file  Occupational History  . Not on file  Social Needs  . Financial resource strain: Not on file  . Food insecurity:    Worry: Not on file    Inability: Not on file  . Transportation needs:    Medical: Not on file    Non-medical: Not on file  Tobacco Use  . Smoking status: Never Smoker  . Smokeless tobacco: Never Used  Substance and Sexual Activity  . Alcohol use: Yes    Comment: None since pregnancy  . Drug use: No  . Sexual activity: Not Currently    Birth control/protection: None  Lifestyle  . Physical activity:    Days per week: Not  on file    Minutes per session: Not on file  . Stress: Not on file  Relationships  . Social connections:    Talks on phone: Not on file    Gets together: Not on file    Attends religious service: Not on file    Active member of club or organization: Not on file    Attends meetings of clubs or organizations: Not on file    Relationship status: Not on file  . Intimate partner violence:    Fear of current or ex partner: Not on file    Emotionally abused: Not on file    Physically abused: Not on file    Forced sexual activity: Not on file  Other Topics Concern  . Not on file  Social History Narrative  . Not on file    Past Surgical Hx:  Past Surgical History:  Procedure Laterality Date  . CESAREAN SECTION N/A 04/28/2014   Procedure: CESAREAN SECTION;  Surgeon: Daleen Bo  Lyn Hollingshead, MD;  Location: [facility];  Service: Obstetrics;  Laterality: N/A;  . CESAREAN SECTION N/A 07/16/2017   Procedure: CESAREAN SECTION;  Surgeon: Everlene Farrier, MD;  Location: [facility];  Service: Obstetrics;  Laterality: N/A;    Past Medical Hx:  Past Medical History:  Diagnosis Date  . Anxiety   . Depression   . GERD (gastroesophageal reflux disease)   . Incomplete bladder emptying 11/20/2017  . Microscopic hematuria 11/20/2017  . Preeclampsia 2015  . Preterm delivery 2015   31 wks d/t preeclampsia    Past Gynecological History:  C/s x 2 No LMP recorded.  Family Hx:  Family History  Problem Relation Age of Onset  . Endometriosis Mother   . Skin cancer Maternal Grandmother   . Lung cancer Paternal Grandfather     Review of Systems:  Constitutional  Feels well,    ENT Normal appearing ears and nares bilaterally Skin/Breast  No rash, sores, jaundice, itching, dryness Cardiovascular  No chest pain, shortness of breath, or edema  Pulmonary  No cough or wheeze.  Gastro Intestinal  No nausea, vomitting, or diarrhoea. No bright red blood per rectum, no abdominal pain, change in bowel  movement, or constipation.  Genito Urinary  + incomplete emptying, lack of sensation to void.  Musculo Skeletal  No myalgia, arthralgia, joint swelling or pain  Neurologic  No weakness, numbness, change in gait,  Psychology  No depression, anxiety, insomnia.   Vitals:  Blood pressure 126/74, pulse 86, temperature 98.3 F (36.8 C), temperature source Oral, resp. rate 20, height 5\' 2"  (1.575 m), SpO2 100 %, unknown if currently breastfeeding.  Physical Exam: WD in NAD Neck  Supple NROM, without any enlargements.  Lymph Node Survey No cervical supraclavicular or inguinal adenopathy Cardiovascular  Pulse normal rate, regularity and rhythm. S1 and S2 normal.  Lungs  Clear to auscultation bilateraly, without wheezes/crackles/rhonchi. Good air movement.  Skin  No rash/lesions/breakdown  Psychiatry  Alert and oriented to person, place, and time  Abdomen  Normoactive bowel sounds, abdomen soft, non-tender and nonobese without evidence of hernia. No masses. Cesarean incision well healed. Back No CVA tenderness Genito Urinary  Vulva/vagina: Normal external female genitalia.   No lesions. No discharge or bleeding.  Bladder/urethra:  No lesions or masses, well supported bladder  Vagina: grossly normal, no lesions visible or palpable  Cervix: Normal appearing, no lesions.  Uterus:  Small, mobile, no parametrial involvement or nodularity.  Adnexa: no masses. Rectal  deferred Extremities  No bilateral cyanosis, clubbing or edema.   Thereasa Solo, MD  12/21/2017, 1:10 PM

## 2017-12-27 ENCOUNTER — Telehealth: Payer: Self-pay | Admitting: *Deleted

## 2017-12-27 NOTE — Telephone Encounter (Signed)
Per [facility] request faxed order for scan to [facility]

## 2017-12-27 NOTE — Telephone Encounter (Signed)
Faxed orders to [facility] and notified the patient. Left the patient a message to get the MRI on a DVD

## 2017-12-28 ENCOUNTER — Ambulatory Visit (HOSPITAL_COMMUNITY): Payer: Managed Care, Other (non HMO)

## 2018-01-09 ENCOUNTER — Encounter: Payer: Self-pay | Admitting: Gynecologic Oncology

## 2018-01-09 ENCOUNTER — Inpatient Hospital Stay: Payer: Managed Care, Other (non HMO) | Admitting: Gynecologic Oncology

## 2018-01-09 VITALS — BP 113/69 | HR 74 | Temp 97.5°F | Resp 18 | Ht 62.0 in | Wt 180.1 lb

## 2018-01-09 DIAGNOSIS — N809 Endometriosis, unspecified: Secondary | ICD-10-CM

## 2018-01-09 DIAGNOSIS — R1900 Intra-abdominal and pelvic swelling, mass and lump, unspecified site: Secondary | ICD-10-CM | POA: Diagnosis not present

## 2018-01-09 DIAGNOSIS — N39490 Overflow incontinence: Secondary | ICD-10-CM | POA: Diagnosis not present

## 2018-01-09 NOTE — Progress Notes (Signed)
Follow-up Note: Gyn-Onc  Consult was requested by Dr. Tresa Simpson for the evaluation of Diana Simpson 32 y.o. female  CC:  Chief Complaint  Patient presents with  . Pelvic mass    Assessment/Plan:  [name]  is a 32 y.o.  year old with microscopic hematuria and sensation of incomplete emptying and a 3cm hematoma associated with the anterior wall of lower uterine segment and posterior wall of the urinary bladder.    I had a lengthy discussion with Diana Simpson and also discussed her case with Dr Diana Simpson. Diana Simpson is reluctant to undergo surgery for this as it is asymptomatic and if not likely malignant and not causing imminent risk/danger to her urinary tract, would prefer to avoid a surgery that might be associated with morbidity.  Instead she is happy to monitor it. She is not interested in scheduling follow-up imaging at this time, but I recommend a repeat scan in approximately 6 months - [place] or MRI, to evaluate the size and ensure it is not enlarging. If it remains asymptomatic and stable, no intervention is necessary.    HPI: [name] is a very pleasant 32 year old woman who is seen in consultation at the request of Dr Diana Simpson for a mass in the vesicouterine plane.   Diana Simpson underwent a cesarean section for her second child on July 16, 2017.  This was performed at approximately [redacted] weeks gestational age due to severe preeclampsia.  Her cesarean section was complicated by maternal hemorrhage from what appears to be the bladder flap.  Review of the operative note identifies that there was profuse vascularity identified in the bladder flap during surgery which required suturing and electrocautery to establish hemostasis.  No specific masses were identified within this space, and there was no apparent percreta or accreta identified at the time of cesarean section.  Immediately postoperatively after a Foley catheter was removed the patient began experiencing pain with urination.  It was severe pain  and she developed fear of voiding from this.  She denies having failed voiding trials post catheter removal or needing catheterization reattempted.  Gradually over the course of several weeks the pain with voiding improved.  However what ensued at that time was the development of an insensate bladder.  She no longer feels the urge to void.  She needs to remind herself to void intermittently.  She has a sense sensation of incomplete emptying.  She denies identifying gross hematuria.  And she denies any pain with micturition.  She has had one menstrual.  Approximately 6 weeks after her cesarean section that was fairly normal.  She has had no menses since that time however she is continuing to breast-feed.  Due to the finding of microscopic hematuria on urine analysis and her sensation of incomplete emptying she was referred for evaluation by Diana Simpson at [facility].   A cystoscopy was performed which did not identify a mass within the bladder however there was some mass-effect on the bladder trigone.  A CT abdomen and pelvis with and without contrast was performed on April 16th, 2019 at [facility]rology which revealed a well-circumscribed indeterminate solid-appearing structure between the anterior lower uterine segment and posterior wall of the urinary bladder.  It measured 2.7 x 3.9 cm.  The uterus is otherwise unremarkable.  No adnexal masses was seen.  The mass appears to course very close to the ureter and trigone.  Of other significance Diana Simpson is positive for factor V Leiden.   She  no longer desires future childbearing due to complications in prior pregnancies.   Interval Hx:  On 12/31/17 she underwent MRI pelvis at [facility]. It showed a uterus measuring 7.1x2.6x4.9cm with no fibroids or other masses. The cervix and vagina were unremarkable. The ovaries bilaterally appeared normal. A T1 hyperintense cystic lesion with a hemosiderin ring is seen between the anterior lower uterine  segment at the c-section scar and the posterior urinary bladder. 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   Current Meds:  Outpatient Encounter Medications as of 01/09/2018  Medication Sig  . CAMILA 0.35 MG tablet Take 1 tablet by mouth daily.  . cetirizine (ZYRTEC) 10 MG tablet Take 10 mg by mouth daily.  Marland Kitchen esomeprazole (NEXIUM) 20 MG capsule Take by mouth daily.  . Prenatal Vit-Fe Fumarate-FA (PRENATAL MULTIVITAMIN) TABS tablet Take 1 tablet by mouth daily.   . sertraline (ZOLOFT) 100 MG tablet Take 1 tablet (100 mg total) by mouth daily. (Patient taking differently: Take 100 mg by mouth at bedtime. Take 2 100mg  by mouth daily)   No facility-administered encounter medications on file as of 01/09/2018.     Allergy:  Allergies  Allergen Reactions  . Adhesive [Tape] Other (See Comments)    Redness, burning, open areas on skin  . Sulfa Antibiotics Other (See Comments)    Childhood reaction    Social Hx:   Social History   Socioeconomic History  . Marital status: Married    Spouse name: Not on file  . Number of children: Not on file  . Years of education: Not on file  . Highest education level: Not on file  Occupational History  . Not on file  Social Needs  . Financial resource strain: Not on file  . Food insecurity:    Worry: Not on file    Inability: Not on file  . Transportation needs:    Medical: Not on file    Non-medical: Not on file  Tobacco Use  . Smoking status: Never Smoker  . Smokeless tobacco: Never Used  Substance and Sexual Activity  . Alcohol use: Yes    Comment: None since pregnancy  . Drug use: No  . Sexual activity: Not Currently    Birth control/protection: None  Lifestyle  . Physical activity:    Days per week: Not on file    Minutes per session: Not on file  . Stress: Not on file  Relationships  . Social connections:    Talks on  phone: Not on file    Gets together: Not on file    Attends religious service: Not on file    Active member of club or organization: Not on file    Attends meetings of clubs or organizations: Not on file    Relationship status: Not on file  . Intimate partner violence:    Fear of current or ex partner: Not on file    Emotionally abused: Not on file    Physically abused: Not on file    Forced sexual activity: Not on file  Other Topics Concern  . Not on file  Social History Narrative  . Not on file    Past Surgical Hx:  Past Surgical History:  Procedure Laterality Date  . CESAREAN SECTION N/A 04/28/2014   Procedure: CESAREAN SECTION;  Surgeon: Diana Katz, MD;  Location: [facility] [facility];  Service: Obstetrics;  Laterality: N/A;  . CESAREAN SECTION N/A  07/16/2017   Procedure: CESAREAN SECTION;  Surgeon: Everlene Farrier, MD;  Location: [place];  Service: Obstetrics;  Laterality: N/A;    Past Medical Hx:  Past Medical History:  Diagnosis Date  . Anxiety   . Depression   . GERD (gastroesophageal reflux disease)   . Incomplete bladder emptying 11/20/2017  . Microscopic hematuria 11/20/2017  . Preeclampsia 2015  . Preterm delivery 2015   31 wks d/t preeclampsia    Past Gynecological History:  C/s x 2 No LMP recorded.  Family Hx:  Family History  Problem Relation Age of Onset  . Endometriosis Mother   . Skin cancer Maternal Grandmother   . Lung cancer Paternal Grandfather     Review of Systems:  Constitutional  Feels well,    ENT Normal appearing ears and nares bilaterally Skin/Breast  No rash, sores, jaundice, itching, dryness Cardiovascular  No chest pain, shortness of breath, or edema  Pulmonary  No cough or wheeze.  Gastro Intestinal  No nausea, vomitting, or diarrhoea. No bright red blood per rectum, no abdominal pain, change in bowel movement, or constipation.  Genito Urinary  + incomplete emptying, lack of sensation to void.  Musculo Skeletal  No  myalgia, arthralgia, joint swelling or pain  Neurologic  No weakness, numbness, change in gait,  Psychology  No depression, anxiety, insomnia.   Vitals:  Blood pressure 113/69, pulse 74, temperature (!) 97.5 F (36.4 C), temperature source Oral, resp. rate 18, height 5\' 2"  (1.575 m), weight 180 lb 1.6 oz (81.7 kg), SpO2 99 %, unknown if currently breastfeeding.  Physical Exam (from 12/21/17): WD in NAD Neck  Supple NROM, without any enlargements.  Lymph Node Survey No cervical supraclavicular or inguinal adenopathy Cardiovascular  Pulse normal rate, regularity and rhythm. S1 and S2 normal.  Lungs  Clear to auscultation bilateraly, without wheezes/crackles/rhonchi. Good air movement.  Skin  No rash/lesions/breakdown  Psychiatry  Alert and oriented to person, place, and time  Abdomen  Normoactive bowel sounds, abdomen soft, non-tender and nonobese without evidence of hernia. No masses. Cesarean incision well healed. Back No CVA tenderness Genito Urinary  Vulva/vagina: Normal external female genitalia.   No lesions. No discharge or bleeding.  Bladder/urethra:  No lesions or masses, well supported bladder  Vagina: grossly normal, no lesions visible or palpable  Cervix: Normal appearing, no lesions.  Uterus:  Small, mobile, no parametrial involvement or nodularity.  Adnexa: no masses. Rectal  deferred Extremities  No bilateral cyanosis, clubbing or edema.   Thereasa Solo, MD  01/09/2018, 5:11 PM

## 2018-01-09 NOTE — Patient Instructions (Signed)
Please contact Dr Denman George at [phone] with questions.  Dr Denman George advises a repeat scan (MRI or ultrasound) in 6-9 months to monitor the stability of the lower uterine segment hematoma. Please let us know at the number above when you would like to schedule this.

## 2018-01-17 ENCOUNTER — Telehealth: Payer: Self-pay | Admitting: *Deleted

## 2018-01-17 NOTE — Telephone Encounter (Signed)
Faxed last office note to [facility] office per request

## 2018-06-25 ENCOUNTER — Telehealth: Payer: Self-pay | Admitting: *Deleted

## 2018-06-25 NOTE — Telephone Encounter (Signed)
Called and left the patient a message to call the office back. Need to see when the patient wants to have her MRI/US done. (between Nov to Feb)

## 2018-06-25 NOTE — Telephone Encounter (Signed)
Patient called back and request her scan appt to be in January.

## 2018-08-20 ENCOUNTER — Telehealth: Payer: Self-pay | Admitting: *Deleted

## 2018-08-20 NOTE — Telephone Encounter (Signed)
Called and left the patient a message to call the office back. Need to see where the patient would like to have her MRI scheduled.

## 2018-08-22 NOTE — Telephone Encounter (Signed)
Pt will call her insurance to be sure that [facility] is the most cost effective for the MRI. She will call the office later today to confirm which facility she will use for her MRI.

## 2018-08-26 ENCOUNTER — Telehealth: Payer: Self-pay | Admitting: *Deleted

## 2018-08-26 NOTE — Telephone Encounter (Signed)
Called and left the patient a message to call the office back with the location of where she would like to have her MRI done

## 2018-08-27 ENCOUNTER — Telehealth: Payer: Self-pay | Admitting: *Deleted

## 2018-08-27 NOTE — Telephone Encounter (Signed)
Faxed the order for the MRI scan and the approval information to [facility] and spoke with patient, gave the information above

## 2018-08-30 ENCOUNTER — Telehealth: Payer: Self-pay | Admitting: *Deleted

## 2018-08-30 NOTE — Telephone Encounter (Signed)
Called and spoke with the patient, I let her know that [facility] was trying to contact her regarding the MRI. Patient will call them and schedule the scan. She will call our office with the date/time of appt to track the results

## 2018-09-09 ENCOUNTER — Telehealth: Payer: Self-pay

## 2018-09-09 NOTE — Telephone Encounter (Signed)
LM for Diana Simpson to call back to discuss the results of the MRI of the Pelvis.

## 2018-09-11 ENCOUNTER — Telehealth: Payer: Self-pay | Admitting: *Deleted

## 2018-09-11 NOTE — Telephone Encounter (Signed)
Reviewed symptoms with Joylene John, NP. She recommends a UA either here or with Dr. Orvan Seen in a couple of weeks. Depending on the results of the urinalysis, she may be referred back to urologist Dr. Tresa Langill.

## 2018-09-11 NOTE — Telephone Encounter (Signed)
Called patient back to follow up on the phone call from our office this morning concerning her urine. Per Joylene John, NP she recommends a UA either here at the cancer center or she can have it done with Dr. Orvan Seen in couple of weeks, depending on the results of the urinalysis, she may be referred back to urologist Dr. Tresa Banta.  Patient states that she has an appointment with Dr. Orvan Seen in a few weeks, and that she will talk with her about doing the UA.  Patient also states that if Dr. Orvan Seen office does not feel comfortable doing the UA, that she will give our office a call back or she will contact Dr. Tresa Nooney from urology.  Patient verbalized understanding.  I told patient if she had any further questions or concerns to please give our office a call and we will be happy to assist her.  Patient verbalized understanding.

## 2018-09-11 NOTE — Telephone Encounter (Signed)
Told Ms Wax that the CT showed that the  Hematoma has decreased in size from 3.5 to 1.7 cm.  There are no other acute findings per Joylene John, NP. Pt states that she is experiences a small amount of incontinence. She does not need to use a pad for the incontinence. She also states that her urine is dark. 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  She is not taking any vitamins currently and drinks a lt of water. Pt has a follow up with  Gyn Dr. Orvan Seen in a couple of weeks.

## 2019-08-25 IMAGING — US US MFM OB DETAIL+14 WK
1 series · 14 of 28 positions shown · non-contrast
Comparison: none

[Series 1: us mfm ob detail+14 wk · 36 acquisitions, 14 frames shown]
[im 2/36]
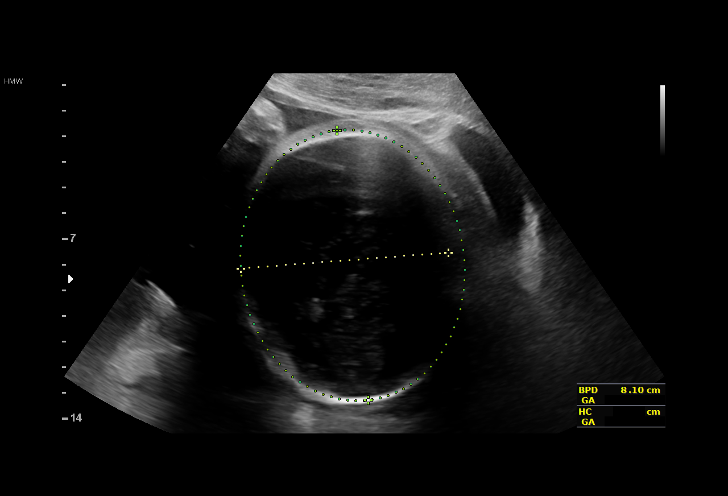
[im 4/36]
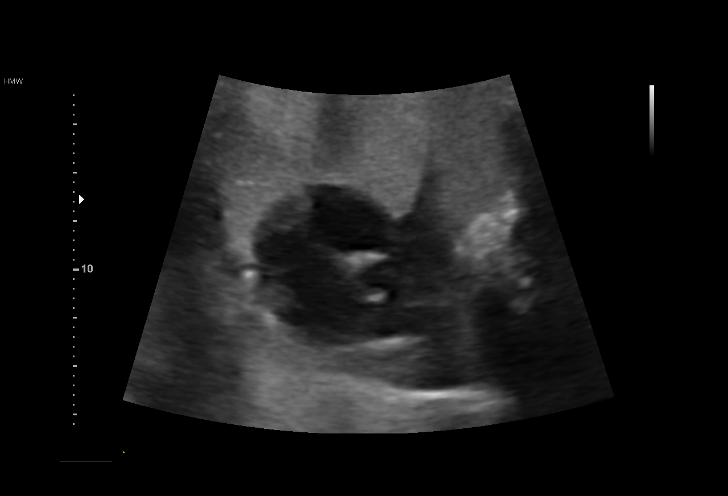
[im 7/36]
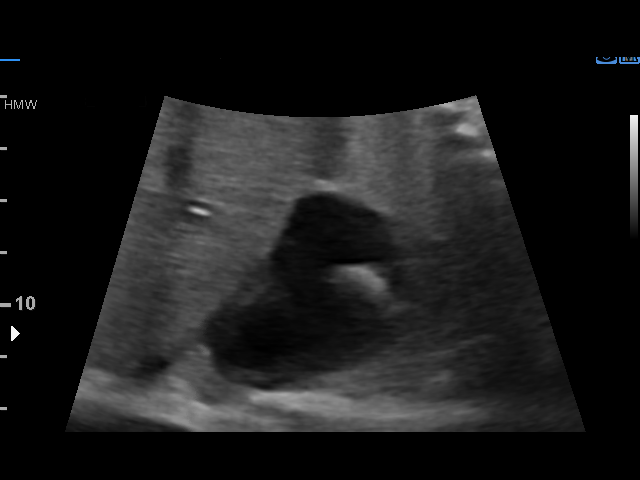
[im 10/36]
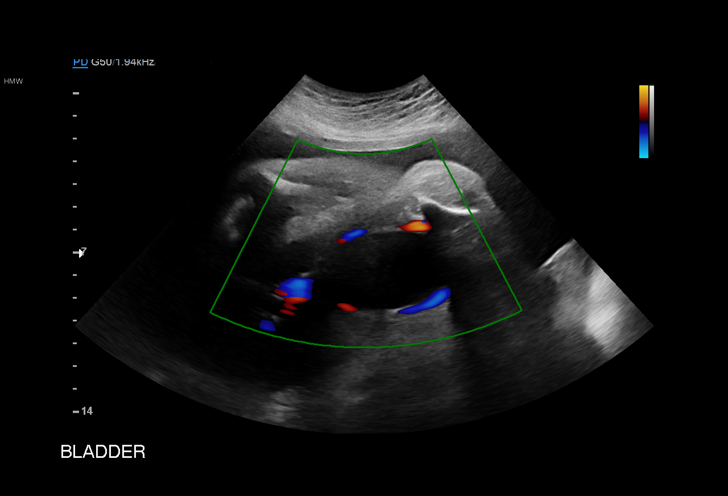
[im 12/36]
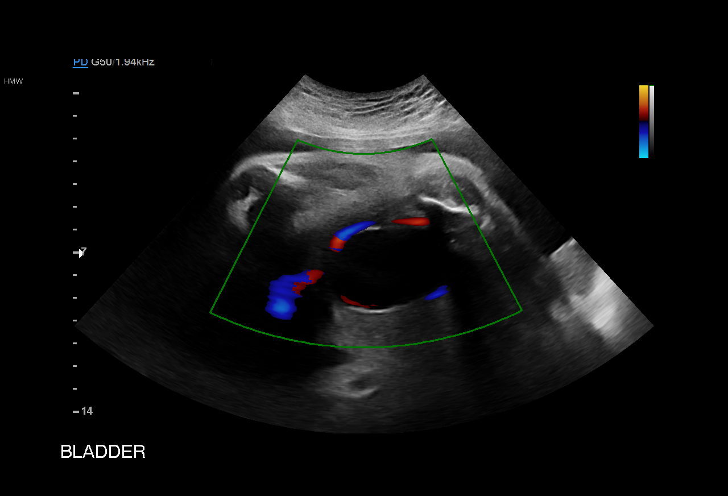
[im 15/36]
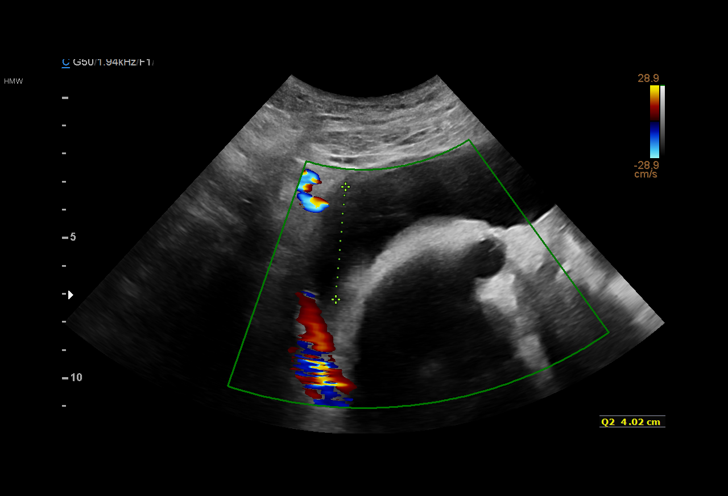
[im 17/36]
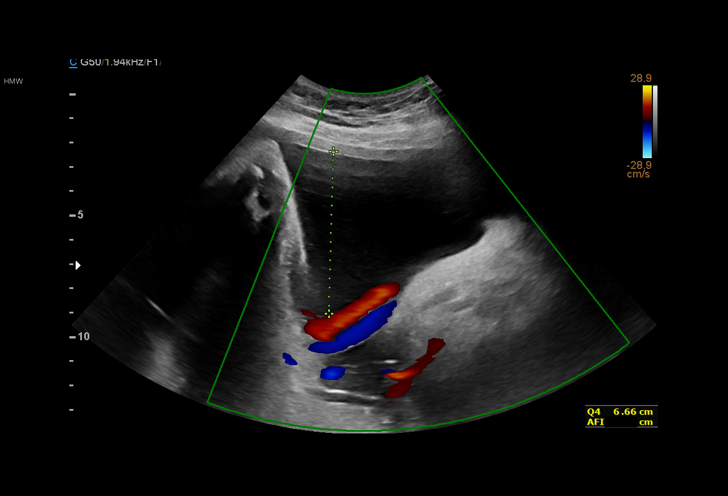
[im 20/36]
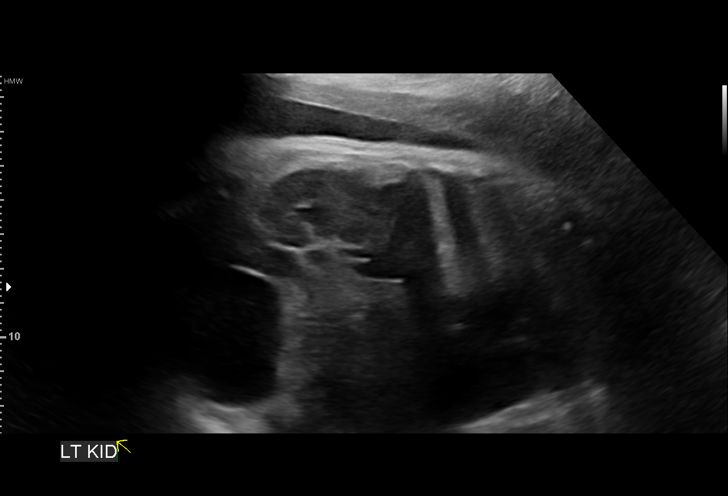
[im 23/36]
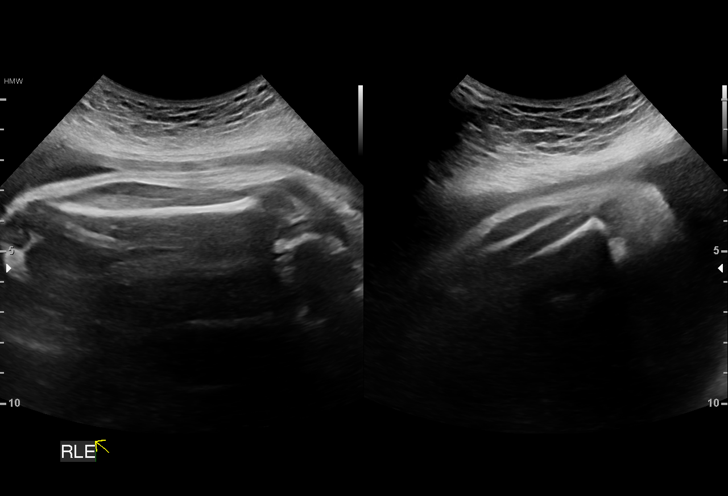
[im 25/36]
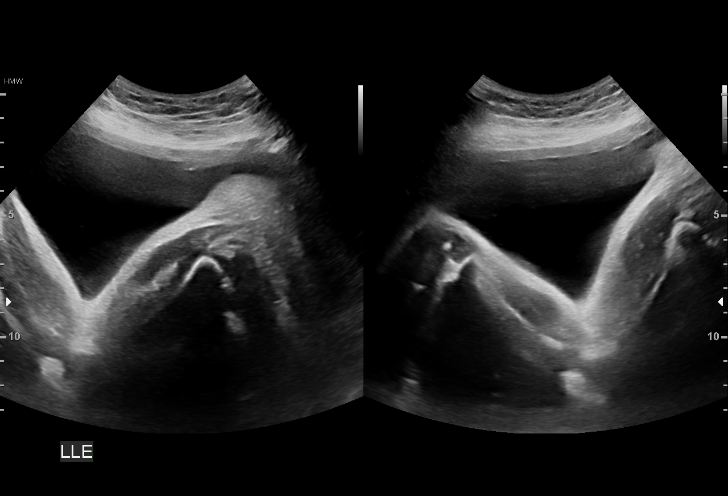
[im 28/36]
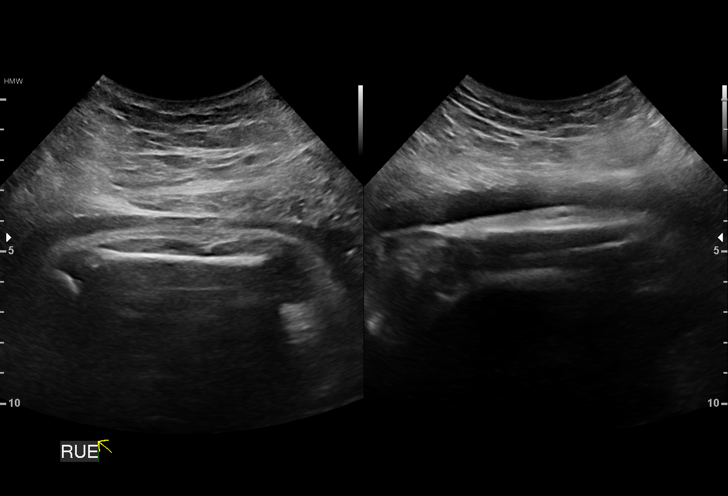
[im 30/36]
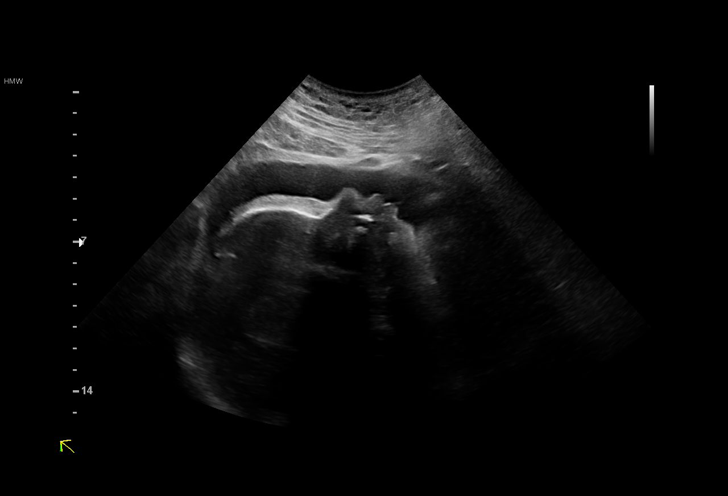
[im 33/36]
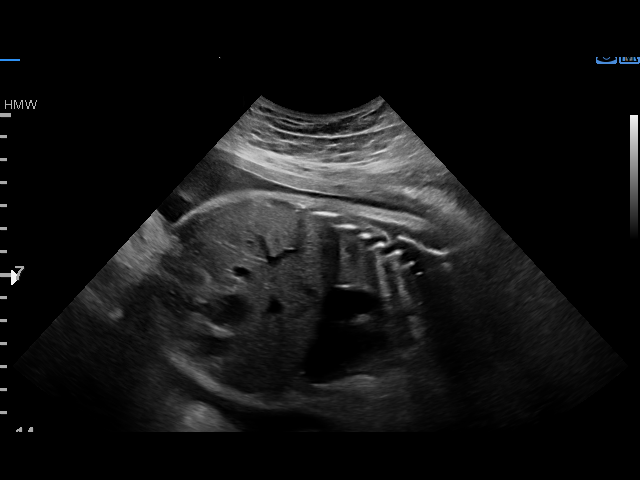
[im 36/36]
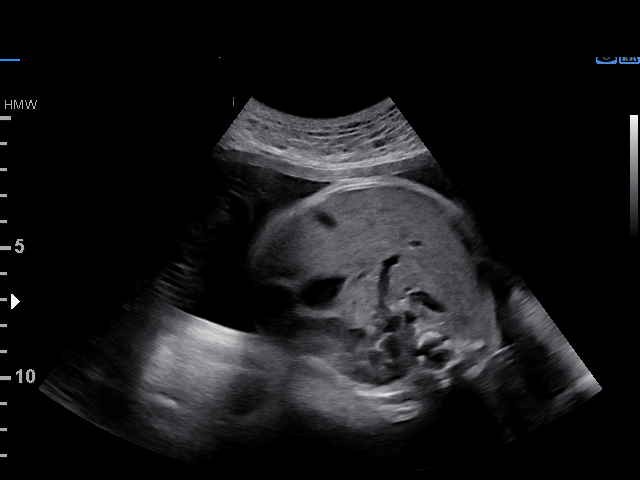

[14 of 28 positions shown; findings below may reference images not displayed]

1  LILLY OG CARL BRINCH-NIELSEN            [id number]      [id number]     [id number]
2  LILLY OG CARL BRINCH-NIELSEN            [id number]      [id number]     [id number]
Indications

34 weeks gestation of pregnancy
Previous cesarean delivery, antepartum
Poor obstetric history: Previous
preeclampsia / eclampsia/gestational HTN
Poor obstetric history: Previous preterm
delivery, antepartum (d/t preE)
Pre-eclampsia
Encounter for fetal anatomic survey
OB History

Gravidity:    2         Term:   0        Prem:   1        SAB:   0
TOP:          0       Ectopic:  0        Living: 1
Fetal Evaluation

Num Of Fetuses:     1
Cardiac Activity:   Observed
Presentation:       Cephalic
Placenta:           Posterior, above cervical os
P. Cord Insertion:  Not well visualized

Amniotic Fluid
AFI FV:      Mild polyhydramnios

AFI Sum(cm)     %Tile       Largest Pocket(cm)
23.88           91

RUQ(cm)       RLQ(cm)       LUQ(cm)        LLQ(cm)
5.16
Biometry

BPD:      79.9  mm     G. Age:  32w 0d          2  %    CI:        72.03   %    70 - 86
FL/HC:      21.9   %    20.1 -
HC:      299.6  mm     G. Age:  33w 2d        < 3  %    HC/AC:      0.95        0.93 -
AC:      314.2  mm     G. Age:  35w 2d         70  %    FL/BPD:     82.1   %    71 - 87
FL:       65.6  mm     G. Age:  33w 6d         18  %    FL/AC:      20.9   %    20 - 24
HUM:      56.8  mm     G. Age:  33w 0d         27  %

Est. FW:    3547  gm      5 lb 5 oz     51  %
Gestational Age

Clinical EDD:  34w 6d                                        EDD:   08/21/17
U/S Today:     33w 4d                                        EDD:   08/30/17
Best:          34w 6d     Det. By:  Clinical EDD             EDD:   08/21/17
Anatomy

Cranium:               Appears normal         Aortic Arch:            Not well visualized
Cavum:                 Not well visualized    Ductal Arch:            Not well visualized
Ventricles:            Not well visualized    Diaphragm:              Appears normal
Choroid Plexus:        Not well visualized    Stomach:                Appears normal, left
sided
Cerebellum:            Not well visualized    Abdomen:                Appears normal
Posterior Fossa:       Not well visualized    Abdominal Wall:         Not well visualized
Nuchal Fold:           Not applicable (>20    Cord Vessels:           Appears normal (3
wks GA)                                        vessel cord)
Face:                  Appears normal         Kidneys:                Appear normal
(orbits and profile)
Lips:                  Not well visualized    Bladder:                Appears normal
Thoracic:              Appears normal         Spine:                  Not well visualized
Heart:                 Not well visualized    Upper Extremities:      Appears normal
RVOT:                  Appears normal         Lower Extremities:      Appears normal
LVOT:                  Appears normal

Other:  Technically difficult due to advanced GA and fetal position.
Impression

SIUP at 34+6 weeks
Cephalic presentation
Normal detailed fetal anatomy; limited anatomy due to fetal
position and advanced gestational age; no gross
abnormalities
High normal amniotic fluid volume vs mild polyhydramnios
Measurements consistent with stated EDC; EFW at the 51st
%tile
BPP [DATE]
Recommendations

Follow-up ultrasounds as clinically indicated.

## 2020-05-19 DIAGNOSIS — J019 Acute sinusitis, unspecified: Secondary | ICD-10-CM | POA: Diagnosis not present

## 2020-07-13 DIAGNOSIS — R1900 Intra-abdominal and pelvic swelling, mass and lump, unspecified site: Secondary | ICD-10-CM | POA: Diagnosis not present

## 2020-07-13 DIAGNOSIS — R310 Gross hematuria: Secondary | ICD-10-CM | POA: Diagnosis not present

## 2020-08-06 DIAGNOSIS — M7711 Lateral epicondylitis, right elbow: Secondary | ICD-10-CM | POA: Diagnosis not present

## 2020-08-06 DIAGNOSIS — S59901A Unspecified injury of right elbow, initial encounter: Secondary | ICD-10-CM | POA: Diagnosis not present

## 2020-08-19 DIAGNOSIS — R310 Gross hematuria: Secondary | ICD-10-CM | POA: Diagnosis not present

## 2020-08-19 DIAGNOSIS — K449 Diaphragmatic hernia without obstruction or gangrene: Secondary | ICD-10-CM | POA: Diagnosis not present

## 2020-08-27 DIAGNOSIS — Z01419 Encounter for gynecological examination (general) (routine) without abnormal findings: Secondary | ICD-10-CM | POA: Diagnosis not present

## 2020-08-27 DIAGNOSIS — Z6839 Body mass index (BMI) 39.0-39.9, adult: Secondary | ICD-10-CM | POA: Diagnosis not present

## 2020-08-30 DIAGNOSIS — R310 Gross hematuria: Secondary | ICD-10-CM | POA: Diagnosis not present

## 2020-11-29 DIAGNOSIS — J069 Acute upper respiratory infection, unspecified: Secondary | ICD-10-CM | POA: Diagnosis not present

## 2020-11-29 DIAGNOSIS — J0110 Acute frontal sinusitis, unspecified: Secondary | ICD-10-CM | POA: Diagnosis not present
